# Patient Record
Sex: Female | Born: 1950
Health system: Southern US, Community
[De-identification: ages and names within clinical notes are randomized; demographics above are authoritative.]

## PROBLEM LIST (undated history)

## (undated) DIAGNOSIS — I10 Essential (primary) hypertension: Secondary | ICD-10-CM

---

## 2001-10-03 ENCOUNTER — Emergency Department (HOSPITAL_COMMUNITY): Admission: EM | Admit: 2001-10-03 | Discharge: 2001-10-04 | Payer: Self-pay | Admitting: Emergency Medicine

## 2001-10-04 ENCOUNTER — Encounter: Payer: Self-pay | Admitting: Emergency Medicine

## 2001-11-19 ENCOUNTER — Encounter: Admission: RE | Admit: 2001-11-19 | Discharge: 2001-11-19 | Payer: Self-pay | Admitting: Internal Medicine

## 2001-11-19 ENCOUNTER — Encounter: Payer: Self-pay | Admitting: Internal Medicine

## 2002-12-10 ENCOUNTER — Encounter: Admission: RE | Admit: 2002-12-10 | Discharge: 2002-12-10 | Payer: Self-pay | Admitting: Internal Medicine

## 2002-12-10 ENCOUNTER — Encounter: Payer: Self-pay | Admitting: Internal Medicine

## 2004-07-29 ENCOUNTER — Emergency Department (HOSPITAL_COMMUNITY): Admission: EM | Admit: 2004-07-29 | Discharge: 2004-07-29 | Payer: Self-pay | Admitting: Emergency Medicine

## 2004-08-09 ENCOUNTER — Encounter: Admission: RE | Admit: 2004-08-09 | Discharge: 2004-08-09 | Payer: Self-pay | Admitting: Internal Medicine

## 2006-07-24 ENCOUNTER — Emergency Department (HOSPITAL_COMMUNITY): Admission: EM | Admit: 2006-07-24 | Discharge: 2006-07-24 | Payer: Self-pay | Admitting: Emergency Medicine

## 2007-01-04 ENCOUNTER — Encounter: Admission: RE | Admit: 2007-01-04 | Discharge: 2007-01-04 | Payer: Self-pay | Admitting: Internal Medicine

## 2007-04-18 ENCOUNTER — Emergency Department (HOSPITAL_COMMUNITY): Admission: EM | Admit: 2007-04-18 | Discharge: 2007-04-19 | Payer: Self-pay | Admitting: Emergency Medicine

## 2007-06-04 ENCOUNTER — Emergency Department (HOSPITAL_COMMUNITY): Admission: EM | Admit: 2007-06-04 | Discharge: 2007-06-05 | Payer: Self-pay | Admitting: Emergency Medicine

## 2007-06-23 ENCOUNTER — Emergency Department (HOSPITAL_COMMUNITY): Admission: EM | Admit: 2007-06-23 | Discharge: 2007-06-23 | Payer: Self-pay | Admitting: Emergency Medicine

## 2007-06-27 ENCOUNTER — Inpatient Hospital Stay (HOSPITAL_COMMUNITY): Admission: EM | Admit: 2007-06-27 | Discharge: 2007-06-27 | Payer: Self-pay | Admitting: Emergency Medicine

## 2007-06-29 ENCOUNTER — Ambulatory Visit (HOSPITAL_BASED_OUTPATIENT_CLINIC_OR_DEPARTMENT_OTHER): Admission: RE | Admit: 2007-06-29 | Discharge: 2007-06-29 | Payer: Self-pay | Admitting: Cardiology

## 2007-07-07 ENCOUNTER — Ambulatory Visit: Payer: Self-pay | Admitting: Internal Medicine

## 2007-11-13 ENCOUNTER — Emergency Department (HOSPITAL_COMMUNITY): Admission: EM | Admit: 2007-11-13 | Discharge: 2007-11-14 | Payer: Self-pay | Admitting: Emergency Medicine

## 2008-07-03 IMAGING — US US ABDOMEN COMPLETE
1 series · 14 of 25 positions shown · non-contrast
Comparison: none

CLINICAL DATA: Chest pain.  Increased amylase.
 ABDOMEN ULTRASOUND:
TECHNIQUE: Complete abdominal ultrasound examination was performed including evaluation of the liver, gallbladder, bile ducts, pancreas, kidneys, spleen, IVC, and abdominal aorta.

[Series 1: unknown · 0.33mm/px · 14 of 57 slices shown]
[im 1/57]
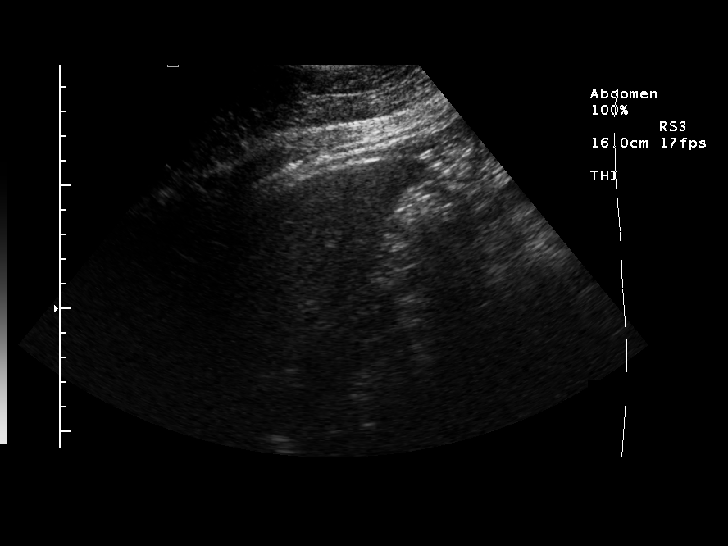
[im 5/57]
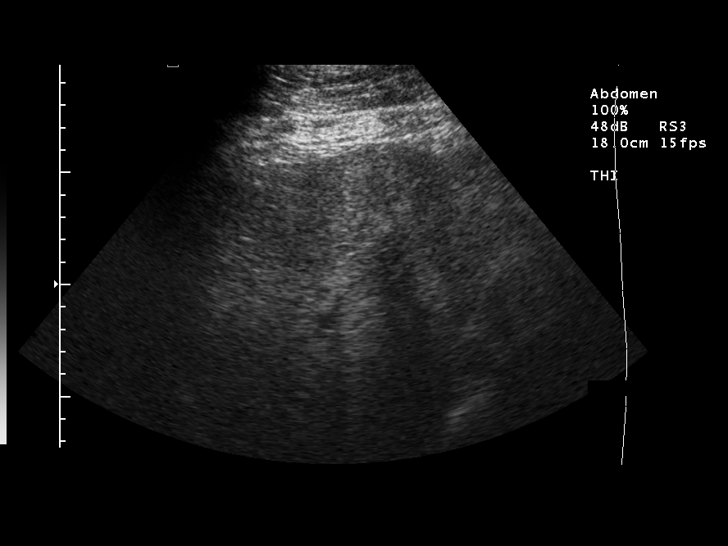
[im 10/57]
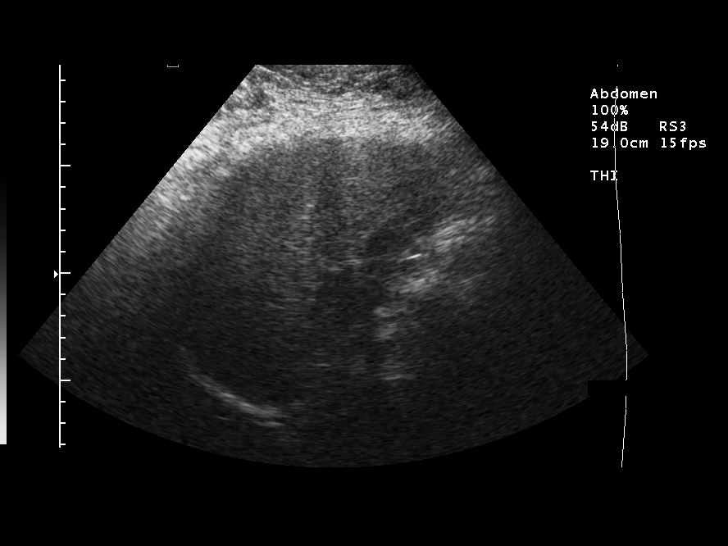
[im 15/57]
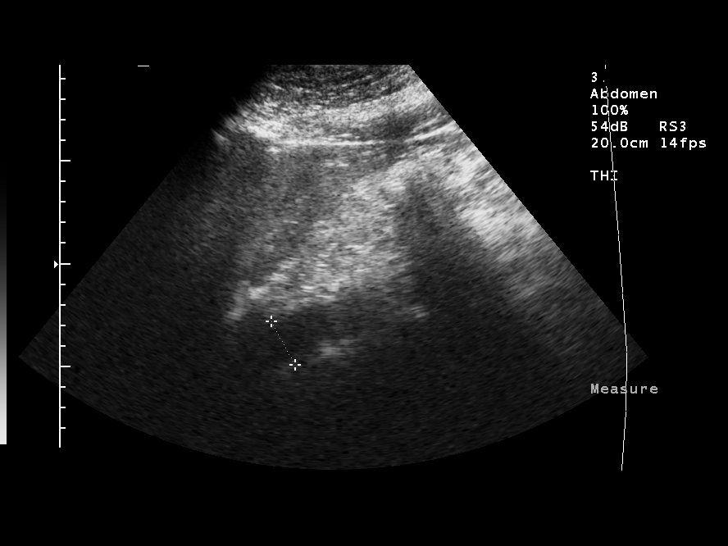
[im 19/57]
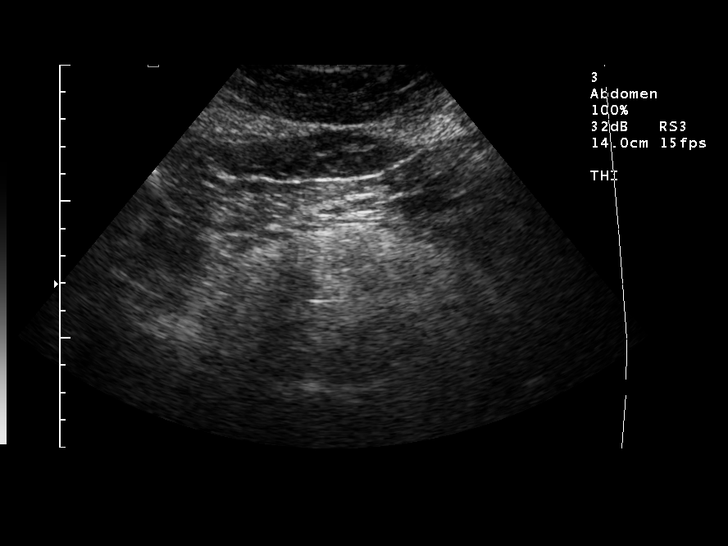
[im 22/57]
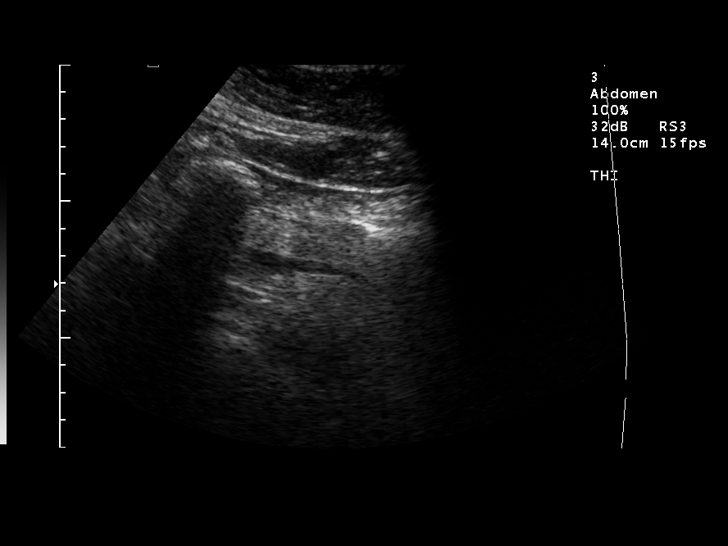
[im 26/57]
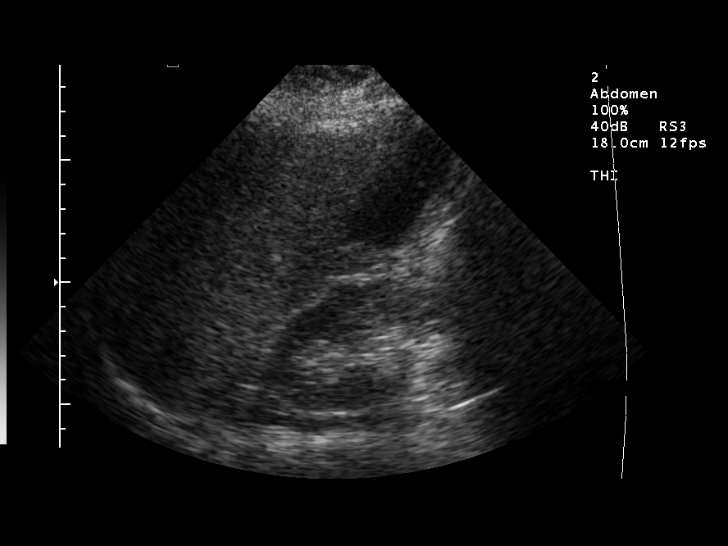
[im 31/57]
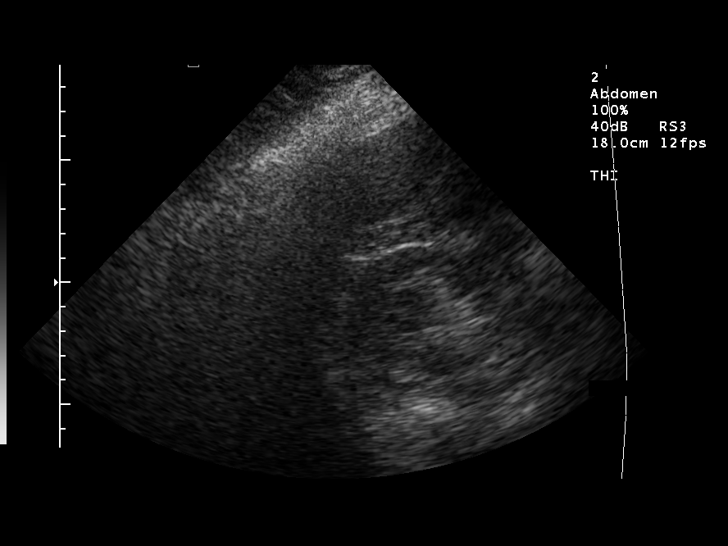
[im 36/57]
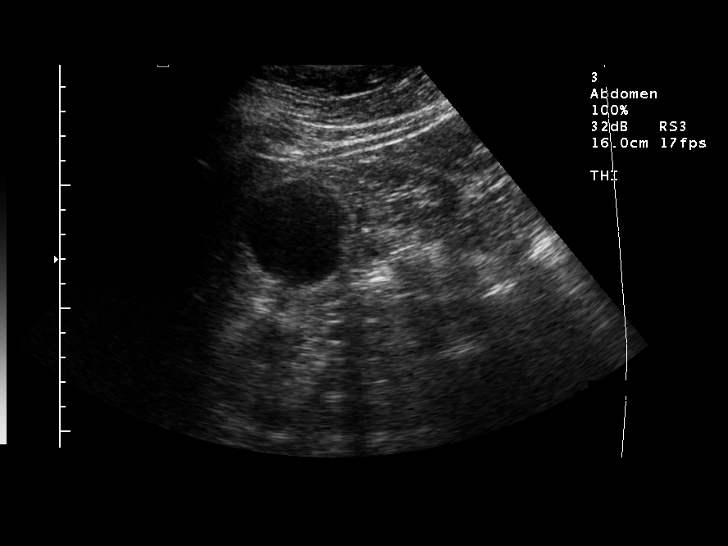
[im 38/57]
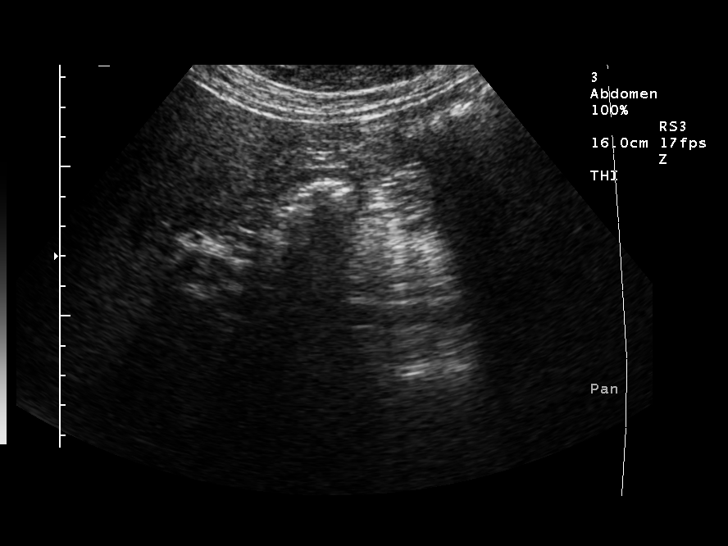
[im 43/57]
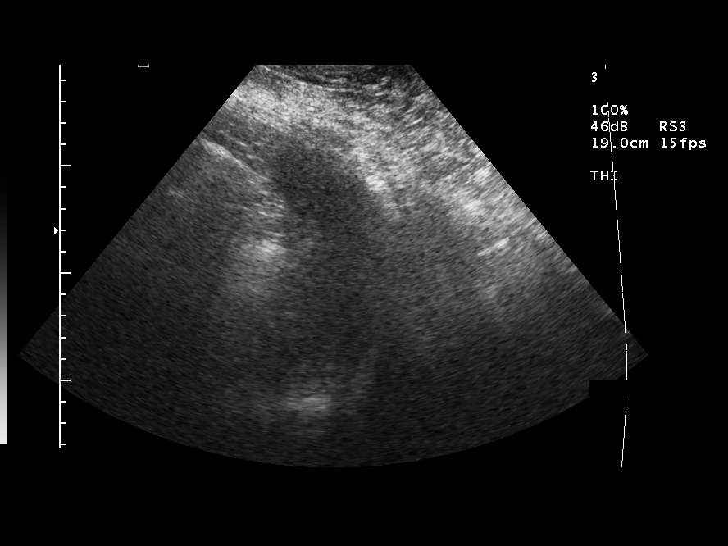
[im 47/57]
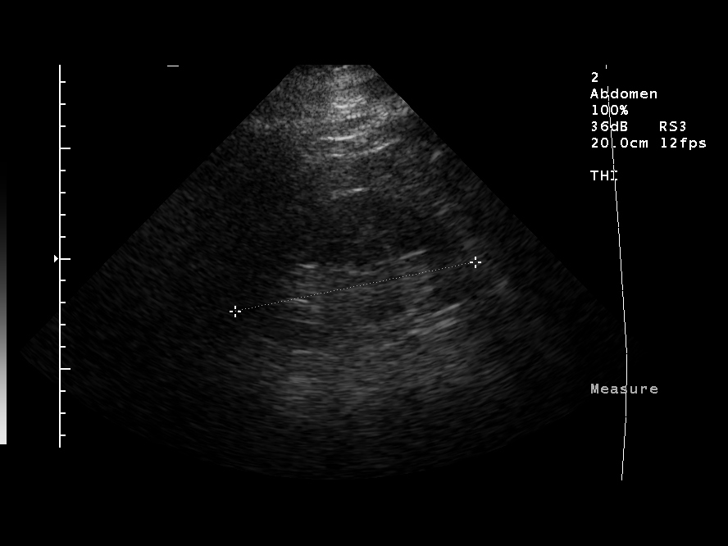
[im 52/57]
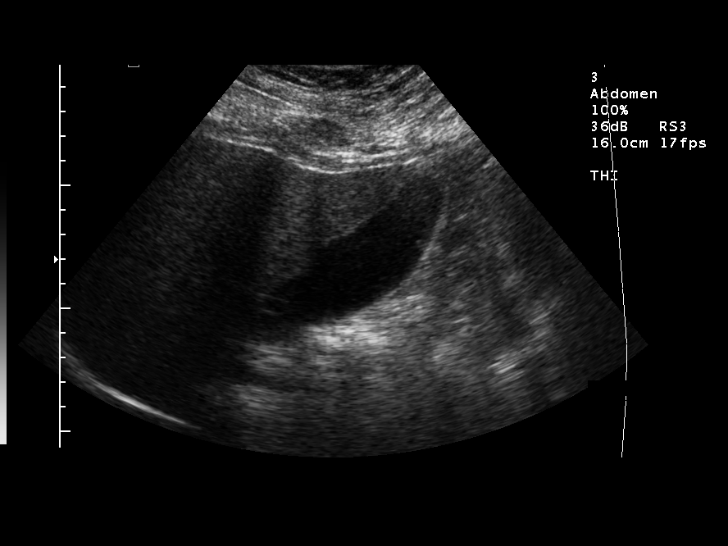
[im 57/57]
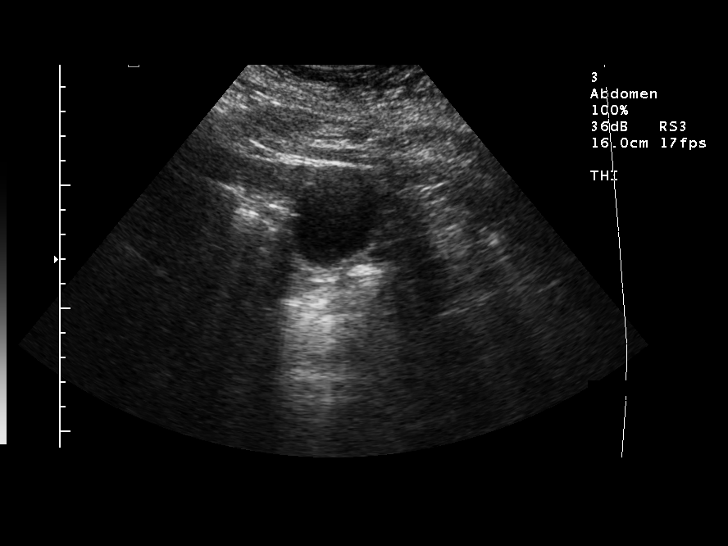

[14 of 25 positions shown; findings below may reference images not displayed]

FINDINGS: The gallbladder is well seen and no gallstones are noted.  The liver is slightly echodense suggesting fatty infiltration.  Common bile duct is normal measuring 3.6 mm in diameter.  The IVC, pancreas and spleen appear normal.  No hydronephrosis is seen.  The right kidney measures 10.7 cm sagittally with the left kidney measuring 11.1 cm. The abdominal aorta is normal in caliber.  This study is somewhat limited by patient body habitus.
IMPRESSION: 1. No gallstones.
 2. Mild fatty infiltration of the liver.

## 2010-02-02 ENCOUNTER — Emergency Department (HOSPITAL_COMMUNITY): Admission: EM | Admit: 2010-02-02 | Discharge: 2010-02-03 | Payer: Self-pay | Admitting: Emergency Medicine

## 2010-07-29 LAB — POCT CARDIAC MARKERS
CKMB, poc: <1 ng/mL — ABNORMAL LOW (ref 1.0–8.0)
CKMB, poc: <1 ng/mL — ABNORMAL LOW (ref 1.0–8.0)
Troponin i, poc: <0.05 ng/mL (ref 0.00–0.09)
Troponin i, poc: <0.05 ng/mL (ref 0.00–0.09)

## 2010-07-29 LAB — DIFFERENTIAL
Basophils Absolute: 0.1 10*3/uL (ref 0.0–0.1)
Basophils Relative: 1 % (ref 0–1)
Lymphs Abs: 1.8 10*3/uL (ref 0.7–4.0)

## 2010-07-29 LAB — CBC
Hemoglobin: 13.5 g/dL (ref 12.0–15.0)
RBC: 4.99 MIL/uL (ref 3.87–5.11)
WBC: 5.1 10*3/uL (ref 4.0–10.5)

## 2010-07-29 LAB — COMPREHENSIVE METABOLIC PANEL
ALT: 17 U/L (ref 0–35)
Alkaline Phosphatase: 71 U/L (ref 39–117)
CO2: 26 mEq/L (ref 19–32)
Chloride: 104 mEq/L (ref 96–112)
GFR calc non Af Amer: 43 mL/min — ABNORMAL LOW (ref >60)
Glucose, Bld: 111 mg/dL — ABNORMAL HIGH (ref 70–99)
Potassium: 3.2 mEq/L — ABNORMAL LOW (ref 3.5–5.1)
Sodium: 135 mEq/L (ref 135–145)
Total Bilirubin: 0.7 mg/dL (ref 0.3–1.2)
Total Protein: 8 g/dL (ref 6.0–8.3)

## 2010-09-28 NOTE — Procedures (Signed)
Annette Walker, Annette Walker               ACCOUNT NO.:  [id number]   MEDICAL RECORD NO.:  [id number]          PATIENT TYPE:  OUT   LOCATION:  SLEEP CENTER                 FACILITY:  [facility]   PHYSICIAN:  Clinton D. Maple Hudson, MD, FCCP, FACPDATE OF BIRTH:  September 01, 1952   DATE OF STUDY:  06/29/2007                            NOCTURNAL POLYSOMNOGRAM   REFERRING PHYSICIAN:  [name], MD   INDICATION FOR STUDY:  Hypersomnia with sleep apnea.   EPWORTH SLEEPINESS SCORE:  4/24 BMI 51.8, weight 311 pounds, height 65  inches. Neck 17 inches.   MEDICATIONS:  Home medication charted and reviewed.   SLEEP ARCHITECTURE:  Total sleep time 378 minutes with sleep efficiency  90.9%.  Stage I was 6.6%.  Stage II was 81.1%.  Stage III 0.9%.  REM  11.4% of total sleep time.  Sleep latency 16 minutes.  REM latency 101  minutes.  Awake after sleep onset 21 minutes.  Arousal index 6.  No  bedtime medication taken.   RESPIRATORY DATA:  Apnea/hypopnea index (AHI) 4.6 per hour.  Respiratory  disturbance index (RDI) 6.8 per hour.  These indicate very mild  obstructive sleep apnea/hypopnea syndrome.  There were a total of 29  scored respiratory events including 9 obstructive apneas, 4 central  apneas and 16 hypopnea's.  Events were not positional.  REM AHI 7 per  hour.   OXYGEN DATA:  Loud snoring with oxygen saturation to a nadir of 79%.  Mean oxygen saturation to this study was 94.6% on room air.  A total of  6.5 minutes were recorded with oxygen saturation less than 88%.   CARDIAC DATA:  Sinus rhythm.   MOVEMENT-PARASOMNIA:  No significant movement disturbance. No bathroom  trips.   IMPRESSIONS-RECOMMENDATIONS:  1. Unremarkable sleep architecture for sleep center environment with      relatively reduced percentage time spent in REM.  2. Very mild obstructive sleep apnea/hypopnea syndrome, AHI 4.6 per      hour, RDI 6.8 per hour (normal range 0-5 per hour).  Events were      not positional.  Loud snoring  with oxygen desaturation transiently      to a nadir of 79% and a mean saturation of 94.6% on room air.  3. Scores in this range would not usually be addressed with CPAP      therapy.  If considered clinically significant,      encouragement to lose weight, to sleep off flat of back, and      treatment for any significant nasopharyngeal obstruction might be      useful if appropriate.      Clinton D. Maple Hudson, MD, [facility], FACP  Diplomate, Biomedical engineer of Sleep Medicine  Electronically Signed     CDY/MEDQ  D:  07/07/2007 11:06:23  T:  07/08/2007 13:25:54  Job:  [id number]

## 2010-09-28 NOTE — Cardiovascular Report (Signed)
Annette Walker, VIRGO NO.:  [id number]   MEDICAL RECORD NO.:  [id number]          PATIENT TYPE:  INP   LOCATION:  6531                         FACILITY:  [facility]   PHYSICIAN:  Nicki Guadalajara, M.D.     DATE OF BIRTH:  1950-06-18   DATE OF PROCEDURE:  06/26/2007  DATE OF DISCHARGE:  06/27/2007                            CARDIAC CATHETERIZATION   INDICATIONS:  Ms. Annette Walker is a 60 year old African-American female  who has a history of obesity and hypertension.  She had experienced  recent episodes of chest pain.  A prior nuclear study had raised the  possibility of possible mild anterior wall ischemia, but the patient was  significantly obese; and due to her large breasts, breast attenuation  could not be excluded.  The patient was, again, seen in the emergency  room with chest pain.  She was scheduled, therefore, to undergo  definitive cardiac catheterization tomorrow by Dr. Lynnea Ferrier.  She  apparently, again, presented to the hospital, today, with chest pain.  She is now referred for definitive catheterization today.   PROCEDURE:  After premedication with Versed 2 mg intravenously, the  patient was prepped and draped in the usual fashion.  Her right femoral  artery was punctured anteriorly, and a 5-French sheath was inserted  without difficulty.  Diagnostic cardiac catheterization was done  utilizing 5-French Judkins-4 left-and-right coronary catheters.  Then 2  mcg IC nitroglycerin was administered down the left coronary catheter.  A 5-French pigtail catheter was used for RAO ventriculography as well as  distal aortography.  The patient tolerated the procedure well.  Hemostasis was obtained by direct manual pressure.   HEMODYNAMIC DATA:  Central aortic pressure was 127/84.  Left ventricular  pressure was 127/22.   ANGIOGRAPHIC DATA:  1. Left main coronary was angiographically normal and trifurcated into      an LAD, the ramus intermediate vessel, and left  circumflex system.      All vessels were tortuous.  2. The LAD gave rise to proximal large diagonal vessel.  The      midportion of the LAD seemed to dip intramyocardially.  In this      segment there appeared to be 30%-40% narrowing.  This did not      significantly improve following intracoronary nitroglycerin, and      remained essentially the same.  There was no significant disease in      other regions of the LAD.  3. The ramus intermediate vessel was a moderate size bifurcating      vessel which was free of significant disease.  4. The circumflex was tortuous.  There was mild 20% narrowing in a      marginal vessel, not felt to be significant.  5. The right coronary artery was a tortuous vessel with a superior      takeoff which was free of significant disease.  6. RAO ventriculography revealed normal global contractility.      Ejection fraction is at least 65%.  There are no focal segmental      wall motion abnormalities.  There was no evidence for  mitral      regurgitation.  7. Distal aortography revealed an essentially normal aortoiliac      system.  Visualization of the ostium of the left iliac was      suboptimal, and initially, although it appeared that there may be a      possible 30% narrowing at this site, there was streaming of dye      making visualization suboptimal.   IMPRESSION:  1. Normal LV function.  2. Very mild coronary artery disease with 30%-40% LAD narrowing in the      mid-LAD and a portion which seemed to dip myocardially, not      significantly improved following IC nitroglycerin administration.  3. Now a 20% narrowing in the OM branch of the left circumflex      coronary artery.  4. Normal ramus intermediate vessel.  5. Normal right coronary artery.   RECOMMENDATIONS:  Medical therapy.           ______________________________  Nicki Guadalajara, M.D.     TK/MEDQ  D:  06/26/2007  T:  06/28/2007  Job:  [id number]   cc:   [name], MD  [name], M.D.

## 2010-10-01 NOTE — Discharge Summary (Signed)
[name], Walker               ACCOUNT NO.:  [id number]   MEDICAL RECORD NO.:  [id number]          PATIENT TYPE:  INP   LOCATION:  6531                         FACILITY:  [facility]   PHYSICIAN:  Annette Walker, Annette WalkerDATE OF BIRTH:  01-14-1951   DATE OF ADMISSION:  06/26/2007  DATE OF DISCHARGE:  06/27/2007                               DISCHARGE SUMMARY   DISCHARGE DIAGNOSES:  1. Chest pain. Negative for myocardial infarction.      a.     Nonobstructive coronary disease by cardiac catheterization.  2. False positive Myoview study.  3. Presyncope prior to chest pain, resolved.  4. Obstructive sleep apnea.  5. Hypertension.  6. Obesity.   DISCHARGE CONDITION:  Improved.   PROCEDURES:  June 26, 2007 combined left heart cath by Dr. Tresa Endo,  20% circ and 20-40% disease of the left system and ejection fraction of  65%.    Please note the patient was not discharged by myself. She was to be  discharged by hospital __________ once her gallbladder ultrasound was  done.   DISCHARGE MEDICATIONS:  1. Benicar 40 mg daily.  2. Metoprolol 100 mg twice a day.  3. Aspirin 81  mg daily.  4. Nitroglycerin sublingual as needed for chest pain.   PRE-HOSPITAL MEDICATIONS:  1. Benicar 40 mg daily.  2. Metoprolol 100 mg twice a day.  3. Aspirin 81  mg daily.  4. Nitroglycerin sublingual as needed for chest pain.   DISCHARGE INSTRUCTIONS:  Follow up with Dr. Lynnea Ferrier in two weeks.   HISTORY OF PRESENT ILLNESS:  A 60 year old African American female who  presented to the emergency room on June 26, 2007 with chest pain.  She had been scheduled for cardiac catheterization on June 27, 2007,  but presented to the emergency room on June 26, 2007. She had a  nuclear study in our office on February 5, and that was positive for  ischemia but she had not agreed to a cardiac cath at that time. She had  continued episodes of chest pain and was seen by Dr. Lynnea Ferrier and planned  for cardiac  cath. On June 26, 2007 on her way to work she felt dizzy  and presyncopal. At work, she was dizzy, got worse, chest pressure built  up. She had no associated shortness of breath, radiation, or  diaphoresis, but because of her continued episodes she came to the  emergency room.   FAMILY HISTORY/SOCIAL HISTORY/REVIEW OF SYSTEMS:  See history and  physical.   PHYSICAL EXAMINATION AT DISCHARGE:  Blood pressure 91/41, pulse 63,  respirations 20, temperature 98.  HEART:  Regular rate and rhythm.  EXTREMITIES:  Right groin site stable.  NECK:  No JVD.  LUNGS:  Clear without rales.   LABORATORY DATA:  Hemoglobin 12.9, hematocrit 37, WBC 4.4, platelets  225, neutrophil 67, lymphs 26.4, eos 2, basos 0. Sodium 141, potassium  3.2, chloride 107, glucose 115, BUN 21, creatinine 1.22 down to 1.03,  total protein 8.9, albumin 3.5, AST 26, ALT 18, alkaline-phosphatase 75,  total bilirubin 0.9, direct bilirubin 0.2, indirect bilirubin 0.7,  amylase was elevated  at 150, lipase 25, CK ranged 113 to 69, MBs were  negative, troponin 0.01 to 0.02 negative. We did a gallbladder  ultrasound. There were no gallstones. She did have a mildly fatty  infiltration of her liver. EKGs reveal sinus rhythm. No acute changes.   HOSPITAL COURSE:  The patient was admitted for chest pain by Dr. Tresa Endo  on June 26, 2007 and underwent elective cardiac catheterization that  same day. Was found to have nonobstructive coronary disease and normal  LV. By the next morning she was stable. Potassium was low; that was  replaced. She was to have her amlodipine changed to [place], but she  continued on Sular at discharge. Gallbladder ultrasound was negative for  gallstones for recurrent chest pain. Her amylase was elevated to 150.  She will need to follow up with her primary care for abnormal liver  functions. She was discharged home and will follow up as an outpatient.      Annette Walker. Annette Walker, N.P.     ______________________________  Annette Walker, M.D.    LRI/MEDQ  D:  07/16/2007  T:  07/16/2007  Job:  [id number]   cc:   Annette Walker, M.D.  [name], MD  [name], M.D.

## 2011-02-03 LAB — COMPREHENSIVE METABOLIC PANEL
Albumin: 3.3 — ABNORMAL LOW
Alkaline Phosphatase: 79
BUN: 28 — ABNORMAL HIGH
Calcium: 8.8
Creatinine, Ser: 2 — ABNORMAL HIGH
Glucose, Bld: 116 — ABNORMAL HIGH
Potassium: 3.4 — ABNORMAL LOW
Total Protein: 7.6

## 2011-02-03 LAB — CBC
Hemoglobin: 12.2
MCHC: 34.6
MCV: 80.7
RBC: 4.37
RDW: 13.7

## 2011-02-03 LAB — DIFFERENTIAL
Eosinophils Absolute: 0.3
Lymphocytes Relative: 33
Lymphs Abs: 2.1
Monocytes Relative: 7
Neutro Abs: 3.5
Neutrophils Relative %: 54

## 2011-02-03 LAB — POCT CARDIAC MARKERS
CKMB, poc: 1.1
Myoglobin, poc: 98.8
Operator id: 1192
Troponin i, poc: <0.05

## 2011-02-04 LAB — POCT CARDIAC MARKERS
CKMB, poc: <1 — ABNORMAL LOW
CKMB, poc: <1 — ABNORMAL LOW
Myoglobin, poc: 81.7
Myoglobin, poc: 106
Myoglobin, poc: 94.1
Operator id: 285841
Operator id: 294501
Troponin i, poc: <0.05

## 2011-02-04 LAB — DIFFERENTIAL
Basophils Absolute: 0
Basophils Absolute: 0
Basophils Relative: 0
Eosinophils Absolute: 0.1
Eosinophils Relative: 3
Lymphs Abs: 2.8
Monocytes Absolute: 0.3
Monocytes Relative: 4
Neutro Abs: 3.1
Neutrophils Relative %: 67

## 2011-02-04 LAB — HEPATIC FUNCTION PANEL
Bilirubin, Direct: 0.2
Indirect Bilirubin: 0.7
Total Bilirubin: 0.9

## 2011-02-04 LAB — CK TOTAL AND CKMB (NOT AT ARMC)
CK, MB: 0.8
CK, MB: 1.1
Total CK: 70

## 2011-02-04 LAB — BASIC METABOLIC PANEL
CO2: 26
CO2: 27
Calcium: 8.7
Chloride: 108
Creatinine, Ser: 1.03
Creatinine, Ser: 1.22 — ABNORMAL HIGH
GFR calc Af Amer: 56 — ABNORMAL LOW
GFR calc Af Amer: >60
GFR calc non Af Amer: 56 — ABNORMAL LOW
Glucose, Bld: 93
Glucose, Bld: 99

## 2011-02-04 LAB — CBC
MCHC: 34.1
MCHC: 34.8
MCV: 81.1
MCV: 81.7
Platelets: ADEQUATE
Platelets: 225
RBC: 4.61
RBC: 3.86 — ABNORMAL LOW
RDW: 14
RDW: 14.5
WBC: 6.4

## 2011-02-04 LAB — COMPREHENSIVE METABOLIC PANEL
ALT: 20
AST: 34
Alkaline Phosphatase: 78
CO2: 25
Calcium: 9.2
GFR calc non Af Amer: 32 — ABNORMAL LOW
Glucose, Bld: 130 — ABNORMAL HIGH
Potassium: 4
Sodium: 138

## 2011-02-04 LAB — CARDIAC PANEL(CRET KIN+CKTOT+MB+TROPI)
Relative Index: INVALID
Total CK: 69
Troponin I: 0.01

## 2011-02-04 LAB — I-STAT 8, (EC8 V) (CONVERTED LAB)
Chloride: 107
Glucose, Bld: 115 — ABNORMAL HIGH
Potassium: 3.2 — ABNORMAL LOW
pCO2, Ven: 37.8 — ABNORMAL LOW
pH, Ven: 7.468 — ABNORMAL HIGH

## 2011-02-04 LAB — AMYLASE: Amylase: 150 — ABNORMAL HIGH

## 2011-02-04 LAB — POCT I-STAT CREATININE: Creatinine, Ser: 1.5 — ABNORMAL HIGH

## 2011-02-04 LAB — LIPASE, BLOOD: Lipase: 25

## 2011-02-10 LAB — POCT CARDIAC MARKERS
CKMB, poc: <1 — ABNORMAL LOW
Myoglobin, poc: 80.9
Troponin i, poc: 0.06 — ABNORMAL HIGH

## 2011-02-10 LAB — CBC
Hemoglobin: 12.4
RBC: 4.43
WBC: 4.9

## 2011-02-10 LAB — CK TOTAL AND CKMB (NOT AT ARMC)
CK, MB: 0.9
Relative Index: INVALID
Total CK: 62

## 2011-02-10 LAB — POCT I-STAT, CHEM 8
HCT: 38
Hemoglobin: 12.9
Potassium: 3.4 — ABNORMAL LOW
Sodium: 141

## 2011-02-10 LAB — DIFFERENTIAL
Lymphs Abs: 1.2
Monocytes Relative: 6
Neutro Abs: 3.2
Neutrophils Relative %: 66

## 2011-02-21 LAB — POCT CARDIAC MARKERS
CKMB, poc: <1 — ABNORMAL LOW
Myoglobin, poc: 59.4
Troponin i, poc: <0.05

## 2011-02-21 LAB — DIFFERENTIAL
Basophils Absolute: 0
Basophils Relative: 0
Monocytes Absolute: 0.3
Neutro Abs: 4.2
Neutrophils Relative %: 68

## 2011-02-21 LAB — I-STAT 8, (EC8 V) (CONVERTED LAB)
BUN: 17
Bicarbonate: 25.4 — ABNORMAL HIGH
Glucose, Bld: 147 — ABNORMAL HIGH
HCT: 40
Operator id: 270111
pCO2, Ven: 31.6 — ABNORMAL LOW

## 2011-02-21 LAB — CBC
Hemoglobin: 12.5
MCHC: 34.8
RDW: 14.5

## 2013-03-27 ENCOUNTER — Ambulatory Visit: Payer: Self-pay | Admitting: Obstetrics & Gynecology

## 2014-01-09 ENCOUNTER — Other Ambulatory Visit: Payer: Self-pay | Admitting: Internal Medicine

## 2014-01-09 DIAGNOSIS — Z1231 Encounter for screening mammogram for malignant neoplasm of breast: Secondary | ICD-10-CM

## 2014-12-19 ENCOUNTER — Other Ambulatory Visit: Payer: Self-pay | Admitting: Internal Medicine

## 2014-12-19 DIAGNOSIS — E2839 Other primary ovarian failure: Secondary | ICD-10-CM

## 2015-08-05 DIAGNOSIS — Z1239 Encounter for other screening for malignant neoplasm of breast: Secondary | ICD-10-CM | POA: Diagnosis not present

## 2015-08-05 DIAGNOSIS — I10 Essential (primary) hypertension: Secondary | ICD-10-CM | POA: Diagnosis not present

## 2015-08-05 DIAGNOSIS — K219 Gastro-esophageal reflux disease without esophagitis: Secondary | ICD-10-CM | POA: Diagnosis not present

## 2015-08-05 DIAGNOSIS — N182 Chronic kidney disease, stage 2 (mild): Secondary | ICD-10-CM | POA: Diagnosis not present

## 2015-09-15 ENCOUNTER — Encounter (HOSPITAL_COMMUNITY): Payer: Self-pay | Admitting: Emergency Medicine

## 2015-09-15 ENCOUNTER — Inpatient Hospital Stay (HOSPITAL_COMMUNITY)
Admission: EM | Admit: 2015-09-15 | Discharge: 2015-09-21 | DRG: 853 | Disposition: A | Discharge status: Home Health | Payer: 59 | Attending: Internal Medicine | Admitting: Internal Medicine

## 2015-09-15 ENCOUNTER — Encounter (HOSPITAL_COMMUNITY)
Admission: EM | Disposition: A | Discharge status: Home Health | Payer: Self-pay | Source: Home / Self Care | Attending: Internal Medicine

## 2015-09-15 ENCOUNTER — Emergency Department (HOSPITAL_COMMUNITY): Payer: 59

## 2015-09-15 ENCOUNTER — Ambulatory Visit (HOSPITAL_COMMUNITY)
Admission: EM | Admit: 2015-09-15 | Discharge: 2015-09-15 | Disposition: A | Discharge status: Nursing Home (Includes ICF) | Payer: 59 | Source: Home / Self Care | Attending: Emergency Medicine | Admitting: Emergency Medicine

## 2015-09-15 DIAGNOSIS — G92 Toxic encephalopathy: Secondary | ICD-10-CM | POA: Diagnosis not present

## 2015-09-15 DIAGNOSIS — J9600 Acute respiratory failure, unspecified whether with hypoxia or hypercapnia: Secondary | ICD-10-CM

## 2015-09-15 DIAGNOSIS — R000 Tachycardia, unspecified: Secondary | ICD-10-CM | POA: Diagnosis present

## 2015-09-15 DIAGNOSIS — R6521 Severe sepsis with septic shock: Secondary | ICD-10-CM | POA: Diagnosis present

## 2015-09-15 DIAGNOSIS — J95821 Acute postprocedural respiratory failure: Secondary | ICD-10-CM | POA: Diagnosis not present

## 2015-09-15 DIAGNOSIS — N179 Acute kidney failure, unspecified: Secondary | ICD-10-CM

## 2015-09-15 DIAGNOSIS — L02214 Cutaneous abscess of groin: Secondary | ICD-10-CM | POA: Diagnosis not present

## 2015-09-15 DIAGNOSIS — I951 Orthostatic hypotension: Secondary | ICD-10-CM | POA: Diagnosis present

## 2015-09-15 DIAGNOSIS — L0291 Cutaneous abscess, unspecified: Secondary | ICD-10-CM | POA: Diagnosis not present

## 2015-09-15 DIAGNOSIS — J9690 Respiratory failure, unspecified, unspecified whether with hypoxia or hypercapnia: Secondary | ICD-10-CM

## 2015-09-15 DIAGNOSIS — E872 Acidosis, unspecified: Secondary | ICD-10-CM | POA: Insufficient documentation

## 2015-09-15 DIAGNOSIS — R531 Weakness: Secondary | ICD-10-CM

## 2015-09-15 DIAGNOSIS — R100 Acute abdomen: Secondary | ICD-10-CM | POA: Diagnosis not present

## 2015-09-15 DIAGNOSIS — E876 Hypokalemia: Secondary | ICD-10-CM | POA: Diagnosis present

## 2015-09-15 DIAGNOSIS — S31109A Unspecified open wound of abdominal wall, unspecified quadrant without penetration into peritoneal cavity, initial encounter: Secondary | ICD-10-CM | POA: Diagnosis present

## 2015-09-15 DIAGNOSIS — Z6841 Body Mass Index (BMI) 40.0 and over, adult: Secondary | ICD-10-CM | POA: Diagnosis not present

## 2015-09-15 DIAGNOSIS — D509 Iron deficiency anemia, unspecified: Secondary | ICD-10-CM | POA: Diagnosis present

## 2015-09-15 DIAGNOSIS — J9601 Acute respiratory failure with hypoxia: Secondary | ICD-10-CM | POA: Diagnosis not present

## 2015-09-15 DIAGNOSIS — S31109D Unspecified open wound of abdominal wall, unspecified quadrant without penetration into peritoneal cavity, subsequent encounter: Secondary | ICD-10-CM | POA: Diagnosis not present

## 2015-09-15 DIAGNOSIS — M726 Necrotizing fasciitis: Secondary | ICD-10-CM

## 2015-09-15 DIAGNOSIS — I10 Essential (primary) hypertension: Secondary | ICD-10-CM | POA: Diagnosis present

## 2015-09-15 DIAGNOSIS — Z7982 Long term (current) use of aspirin: Secondary | ICD-10-CM

## 2015-09-15 DIAGNOSIS — A419 Sepsis, unspecified organism: Principal | ICD-10-CM | POA: Diagnosis present

## 2015-09-15 DIAGNOSIS — R109 Unspecified abdominal pain: Secondary | ICD-10-CM

## 2015-09-15 DIAGNOSIS — L0889 Other specified local infections of the skin and subcutaneous tissue: Secondary | ICD-10-CM | POA: Diagnosis not present

## 2015-09-15 DIAGNOSIS — E869 Volume depletion, unspecified: Secondary | ICD-10-CM | POA: Diagnosis present

## 2015-09-15 HISTORY — PX: IRRIGATION AND DEBRIDEMENT ABDOMEN: SHX6600

## 2015-09-15 HISTORY — DX: Essential (primary) hypertension: I10

## 2015-09-15 LAB — CBC WITH DIFFERENTIAL/PLATELET
BASOS ABS: 0 10*3/uL (ref 0.0–0.1)
Basophils Relative: 0 %
EOS ABS: 0 10*3/uL (ref 0.0–0.7)
Eosinophils Relative: 0 %
HCT: 35.4 % — ABNORMAL LOW (ref 36.0–46.0)
HEMOGLOBIN: 12.1 g/dL (ref 12.0–15.0)
LYMPHS ABS: 1 10*3/uL (ref 0.7–4.0)
LYMPHS PCT: 4 %
MCH: 27.4 pg (ref 26.0–34.0)
MCHC: 34.2 g/dL (ref 30.0–36.0)
MCV: 80.3 fL (ref 78.0–100.0)
Monocytes Absolute: 2.2 10*3/uL — ABNORMAL HIGH (ref 0.1–1.0)
Monocytes Relative: 9 %
NEUTROS PCT: 87 %
Neutro Abs: 21 10*3/uL — ABNORMAL HIGH (ref 1.7–7.7)
PLATELETS: 254 10*3/uL (ref 150–400)
RBC: 4.41 MIL/uL (ref 3.87–5.11)
RDW: 14.5 % (ref 11.5–15.5)
WBC: 24.3 10*3/uL — AB (ref 4.0–10.5)

## 2015-09-15 LAB — COMPREHENSIVE METABOLIC PANEL
ALK PHOS: 73 U/L (ref 38–126)
ALT: 23 U/L (ref 14–54)
AST: 43 U/L — AB (ref 15–41)
Albumin: 2.5 g/dL — ABNORMAL LOW (ref 3.5–5.0)
Anion gap: 21 — ABNORMAL HIGH (ref 5–15)
BILIRUBIN TOTAL: 1.7 mg/dL — AB (ref 0.3–1.2)
BUN: 51 mg/dL — AB (ref 6–20)
CHLORIDE: 97 mmol/L — AB (ref 101–111)
CO2: 18 mmol/L — ABNORMAL LOW (ref 22–32)
Calcium: 8.6 mg/dL — ABNORMAL LOW (ref 8.9–10.3)
Creatinine, Ser: 4.5 mg/dL — ABNORMAL HIGH (ref 0.44–1.00)
GFR calc Af Amer: 11 mL/min — ABNORMAL LOW (ref >60)
GFR, EST NON AFRICAN AMERICAN: 10 mL/min — AB (ref >60)
Glucose, Bld: 136 mg/dL — ABNORMAL HIGH (ref 65–99)
Potassium: 2.9 mmol/L — ABNORMAL LOW (ref 3.5–5.1)
Sodium: 136 mmol/L (ref 135–145)
Total Protein: 8 g/dL (ref 6.5–8.1)

## 2015-09-15 LAB — I-STAT CG4 LACTIC ACID, ED: Lactic Acid, Venous: 6.09 mmol/L (ref 0.5–2.0)

## 2015-09-15 SURGERY — IRRIGATION AND DEBRIDEMENT ABDOMEN
Anesthesia: General | Site: Abdomen

## 2015-09-15 MED ORDER — MIDAZOLAM HCL 2 MG/2ML IJ SOLN
INTRAMUSCULAR | Status: AC
  Filled 2015-09-15: qty 2

## 2015-09-15 MED ORDER — SODIUM CHLORIDE 0.9 % IV BOLUS (SEPSIS)
1000.0000 mL | Freq: Once | INTRAVENOUS | Status: AC
  Administered 2015-09-15: 1000 mL via INTRAVENOUS

## 2015-09-15 MED ORDER — FENTANYL CITRATE (PF) 250 MCG/5ML IJ SOLN
INTRAMUSCULAR | Status: AC
  Filled 2015-09-15: qty 5

## 2015-09-15 MED ORDER — SODIUM CHLORIDE 0.9 % IV BOLUS (SEPSIS)
500.0000 mL | Freq: Once | INTRAVENOUS | Status: AC
  Administered 2015-09-15: 500 mL via INTRAVENOUS

## 2015-09-15 MED ORDER — PIPERACILLIN-TAZOBACTAM IN DEX 2-0.25 GM/50ML IV SOLN
2.2500 g | Freq: Three times a day (TID) | INTRAVENOUS | Status: DC
  Administered 2015-09-16 (×2): 2.25 g via INTRAVENOUS
  Filled 2015-09-15 (×5): qty 50

## 2015-09-15 MED ORDER — VANCOMYCIN HCL 10 G IV SOLR
2500.0000 mg | Freq: Once | INTRAVENOUS | Status: AC
  Administered 2015-09-15: 2500 mg via INTRAVENOUS
  Filled 2015-09-15: qty 2500

## 2015-09-15 MED ORDER — PIPERACILLIN-TAZOBACTAM 3.375 G IVPB 30 MIN
3.3750 g | INTRAVENOUS | Status: AC
  Administered 2015-09-15: 3.375 g via INTRAVENOUS
  Filled 2015-09-15: qty 50

## 2015-09-15 MED ORDER — CLINDAMYCIN PHOSPHATE 600 MG/50ML IV SOLN
600.0000 mg | Freq: Once | INTRAVENOUS | Status: AC
  Administered 2015-09-16: 600 mg via INTRAVENOUS
  Filled 2015-09-15: qty 50

## 2015-09-15 MED ORDER — PROPOFOL 10 MG/ML IV BOLUS
INTRAVENOUS | Status: AC
  Filled 2015-09-15: qty 20

## 2015-09-15 SURGICAL SUPPLY — 31 items
BNDG GAUZE ELAST 4 BULKY (GAUZE/BANDAGES/DRESSINGS) ×4 IMPLANT
CANISTER SUCT LVC 12 LTR MEDI- (MISCELLANEOUS) IMPLANT
CANISTER SUCTION 2500CC (MISCELLANEOUS) ×2 IMPLANT
CANISTER WOUND CARE 500ML ATS (WOUND CARE) ×2 IMPLANT
COVER SURGICAL LIGHT HANDLE (MISCELLANEOUS) ×2 IMPLANT
DRAPE LAPAROSCOPIC ABDOMINAL (DRAPES) ×2 IMPLANT
DRAPE UTILITY XL STRL (DRAPES) ×4 IMPLANT
DRSG PAD ABDOMINAL 8X10 ST (GAUZE/BANDAGES/DRESSINGS) ×4 IMPLANT
ELECT REM PT RETURN 9FT ADLT (ELECTROSURGICAL) ×2
ELECTRODE REM PT RTRN 9FT ADLT (ELECTROSURGICAL) ×1 IMPLANT
GLOVE BIO SURGEON STRL SZ8 (GLOVE) ×2 IMPLANT
GLOVE BIOGEL PI IND STRL 8 (GLOVE) ×1 IMPLANT
GLOVE BIOGEL PI INDICATOR 8 (GLOVE) ×1
GOWN STRL REUS W/ TWL LRG LVL3 (GOWN DISPOSABLE) ×2 IMPLANT
GOWN STRL REUS W/ TWL XL LVL3 (GOWN DISPOSABLE) ×1 IMPLANT
GOWN STRL REUS W/TWL LRG LVL3 (GOWN DISPOSABLE) ×2
GOWN STRL REUS W/TWL XL LVL3 (GOWN DISPOSABLE) ×1
HANDPIECE INTERPULSE COAX TIP (DISPOSABLE) ×2
KIT BASIN OR (CUSTOM PROCEDURE TRAY) ×2 IMPLANT
KIT ROOM TURNOVER OR (KITS) ×2 IMPLANT
NS IRRIG 1000ML POUR BTL (IV SOLUTION) ×2 IMPLANT
PACK GENERAL/GYN (CUSTOM PROCEDURE TRAY) ×2 IMPLANT
PAD ARMBOARD 7.5X6 YLW CONV (MISCELLANEOUS) ×4 IMPLANT
SET CYSTO W/LG BORE CLAMP LF (SET/KITS/TRAYS/PACK) ×2 IMPLANT
SET HNDPC FAN SPRY TIP SCT (DISPOSABLE) ×1 IMPLANT
SPONGE ABDOMINAL VAC ABTHERA (MISCELLANEOUS) ×2 IMPLANT
SUCTION POOLE TIP (SUCTIONS) IMPLANT
TAPE CLOTH SURG 6X10 WHT LF (GAUZE/BANDAGES/DRESSINGS) ×2 IMPLANT
TOWEL OR 17X24 6PK STRL BLUE (TOWEL DISPOSABLE) ×2 IMPLANT
TOWEL OR 17X26 10 PK STRL BLUE (TOWEL DISPOSABLE) ×2 IMPLANT
WATER STERILE IRR 1000ML POUR (IV SOLUTION) IMPLANT

## 2015-09-15 NOTE — Progress Notes (Addendum)
  Addendum: SCr 4.50 - estimated CrCl 19.1 mL/min.   Plan: No further vancomycin - follow-up renal function, likely will need >= 48 hour dosing per obesity nomogram.  Zosyn 2.25g IV every 8 hours.   Link SnufferJessica Moria Brophy, PharmD, BCPS Clinical Pharmacist [phone] 09/15/2015, 8:46 PM   Pharmacy Antibiotic Note  Annette Walker is a 65 y.o. female admitted on 09/15/2015 with sepsis.  Pharmacy has been consulted for vancomycin and Zosyn dosing. WBC and Lactic acid elevated.   Plan: Vancomycin 2500mg  IV x1 now Zosyn 3.375g IV x1 now over 30min Further doses pending SCr.   Height: 5\' 6"  (167.6 cm) Weight: (!) 319 lb (144.697 kg) IBW/kg (Calculated) : 59.3  Temp (24hrs), Avg:99.1 F (37.3 C), Min:98.4 F (36.9 C), Max:99.8 F (37.7 C)   Recent Labs Lab 09/15/15 1929 09/15/15 1946  WBC 24.3*  --   LATICACIDVEN  --  6.09*    CrCl cannot be calculated (Patient has no serum creatinine result on file.).    No Known Allergies  Antimicrobials this admission: 5/2 Vancomycin >> 5/2 Zosyn >>  Dose adjustments this admission: na  Microbiology results: 5/2 BCx:  5/2 UCx:   Thank you for allowing pharmacy to be a part of this patient's care.  Link SnufferJessica Donoven Pett, PharmD, BCPS Clinical Pharmacist [phone]  09/15/2015 8:23 PM

## 2015-09-15 NOTE — ED Provider Notes (Signed)
CSN: [id number]     Arrival date & time 09/15/15  1852 History   First MD Initiated Contact with Patient 09/15/15 1947     Chief Complaint  Patient presents with  . Abscess     (Consider location/radiation/quality/duration/timing/severity/associated sxs/prior Treatment) HPI Annette Walker is a 65 year old female who presents the emergency department tonight after being sent here from urgent care with concern for soft tissue infection. She reports that 2 days ago she noticed what she thought was a "boil" on her right inguinal area. She says that it continued to grow, and then today began draining purulent fluid. She has been feeling a little bit fatigued and tired today, but until today had been feeling in her usual state of health. She denies any fevers or chills, but does feel fatigued, and lightheaded with standing. She was evaluated at urgent care, and sent here due to concern for serious soft tissue infection. She complains of a painful, draining, wound on her right inguinal crease. She says it's been draining purulent, foul-smelling fluid all day. She has no history of serious soft tissue or skin infections. She has no history of abscesses. She is not diabetic.   Past Medical History  Diagnosis Date  . Hypertension    History reviewed. No pertinent past surgical history. No family history on file. Social History  Substance Use Topics  . Smoking status: Never Smoker   . Smokeless tobacco: None  . Alcohol Use: No   OB History    No data available     Review of Systems  Constitutional: Positive for fatigue.  Skin: Positive for wound.  Neurological: Positive for light-headedness.      Allergies  Review of patient's allergies indicates no known allergies.  Home Medications   Prior to Admission medications   Medication Sig Start Date End Date Taking? Authorizing Provider  aspirin 81 MG tablet Take 81 mg by mouth daily.   Yes Historical Provider, MD  metoprolol succinate  (TOPROL-XL) 50 MG 24 hr tablet Take 50 mg by mouth daily. Take with or immediately following a meal.   Yes Historical Provider, MD  olmesartan-hydrochlorothiazide (BENICAR HCT) 40-25 MG tablet Take 1 tablet by mouth daily. 09/03/15  Yes Historical Provider, MD   BP 129/98 mmHg  Pulse 109  Temp(Src) 98.4 F (36.9 C) (Oral)  Resp 14  Ht 5\' 6"  (1.676 m)  Wt 144.697 kg  BMI 51.51 kg/m2  SpO2 97%  LMP  (Approximate) Physical Exam  Constitutional: No distress.  HENT:  Head: Normocephalic.  Eyes: Pupils are equal, round, and reactive to light.  Neck: Normal range of motion. Neck supple.  Cardiovascular: Normal rate and regular rhythm.   Pulmonary/Chest: Effort normal and breath sounds normal.  Abdominal: Soft. Bowel sounds are normal. She exhibits no distension. There is no tenderness.  Skin:  Extensive indurated, warm, red right inguinal fold. Areas of induration outlined in skin marker. Area of skin breakdown draining. With fluid in the inguinal crease. Palpation of skin medial will express bubbles of air and purulence from the wound, one small bulla  Nursing note and vitals reviewed.   ED Course  Procedures (including critical care time) Labs Review Labs Reviewed  COMPREHENSIVE METABOLIC PANEL - Abnormal; Notable for the following:    Potassium 2.9 (*)    Chloride 97 (*)    CO2 18 (*)    Glucose, Bld 136 (*)    BUN 51 (*)    Creatinine, Ser 4.50 (*)    Calcium 8.6 (*)  Albumin 2.5 (*)    AST 43 (*)    Total Bilirubin 1.7 (*)    GFR calc non Af Amer 10 (*)    GFR calc Af Amer 11 (*)    Anion gap 21 (*)    All other components within normal limits  CBC WITH DIFFERENTIAL/PLATELET - Abnormal; Notable for the following:    WBC 24.3 (*)    HCT 35.4 (*)    Neutro Abs 21.0 (*)    Monocytes Absolute 2.2 (*)    All other components within normal limits  I-STAT CG4 LACTIC ACID, ED - Abnormal; Notable for the following:    Lactic Acid, Venous 6.09 (*)    All other components  within normal limits  CULTURE, BLOOD (ROUTINE X 2)  CULTURE, BLOOD (ROUTINE X 2)  URINE CULTURE  URINALYSIS, ROUTINE W REFLEX MICROSCOPIC (NOT AT [facility])  I-STAT CG4 LACTIC ACID, ED    Imaging Review Ct Abdomen Pelvis Wo Contrast  09/15/2015  CLINICAL DATA:  Acute abdominal pain. EXAM: CT ABDOMEN AND PELVIS WITHOUT CONTRAST TECHNIQUE: Multidetector CT imaging of the abdomen and pelvis was performed following the standard protocol without IV contrast. COMPARISON:  None. FINDINGS: The lung bases are clear. Diffuse fatty infiltration of the liver. The unenhanced appearance of the gallbladder, pancreas, spleen, adrenal glands, kidneys, abdominal aorta, inferior vena cava, and retroperitoneal lymph nodes is unremarkable. The stomach, small bowel, and colon are decompressed. Small periumbilical hernia containing fat. No free air or free fluid in the abdomen. Pelvis: The appendix is normal. Uterus and ovaries are not enlarged. Intrauterine device is present. 2 cm lesion in the left ovary contains macroscopic fat likely representing a dermoid cyst. No pelvic mass or lymphadenopathy. There is infiltration throughout the subcutaneous fat of the low pelvis, extending over the right hip and groin region. There is prominent soft tissue gas. No discrete abscess. Appearance is consistent with infection with gas producing organism. Prominent lymph nodes in the right groin region are likely reactive. Degenerative changes in the spine. No destructive bone lesions. IMPRESSION: Infiltration and gas in the soft tissues of the low right pelvis extending over the right hip and groin region. Changes are consistent with necrotizing fasciitis due to gas-forming organism. No discrete abscess. Diffuse fatty infiltration of the liver. Small dermoid cyst in the left ovary. These results were called by telephone at the time of interpretation on 09/15/2015 at 10:18 pm to Dr. Erskine Emery , who verbally acknowledged these results.  Electronically Signed   By: Burman Nieves M.D.   On: 09/15/2015 22:20   I have personally reviewed and evaluated these images and lab results as part of my medical decision-making.   EKG Interpretation None      MDM   Final diagnoses:  Necrotizing fasciitis [facility])   Patient presents with necrotizing fasciitis. On arrival, the patient is ambulatory, appears well, and has normal vital signs. She was noted to help a mild tachycardia, but is afebrile, not hypotensive, though she did have orthostatic hypotension noted at urgent care.  On exam the patient has extensive area of cellulitis, draining pus, and with palpation along the area of cellulitis, he seemed to be able to express purulence and bubbles of air, highly concerned for necrotizing fasciitis.  Labs show uremia, acute renal failure, profound metabolic acidosis with elevated lactate. Patient has a leukocytosis with bandemia,  10:43 PM Spoke with Dr. Luisa Hart from general surgery, plans to evaluate patient and take her emergently to the OR.  I anticipate  this patient will need ICU care postoperatively, discussed case with Dr. Vaughan BastaSummer from [facility], plan for ICU admission postoperatively.  Erskine Emeryhris Cosimo Schertzer, MD 09/15/15 [id number]  Rolland PorterMark James, MD 09/15/15 2352

## 2015-09-15 NOTE — ED Notes (Signed)
Dr. Donnald GarrePfeiffer ( EDP ) notified on pt.'s arrival at triage .

## 2015-09-15 NOTE — Anesthesia Preprocedure Evaluation (Addendum)
Anesthesia Evaluation  Patient identified by MRN, date of birth, ID band Patient awake    Reviewed: Allergy & Precautions, NPO status , Patient's Chart, lab work & pertinent test results, reviewed documented beta blocker date and time   Airway Mallampati: I  TM Distance: >3 FB Neck ROM: Full    Dental  (+) Teeth Intact, Dental Advisory Given   Pulmonary    breath sounds clear to auscultation       Cardiovascular hypertension, Pt. on medications and Pt. on home beta blockers  Rhythm:Regular Rate:Normal     Neuro/Psych    GI/Hepatic   Endo/Other  Morbid obesity  Renal/GU      Musculoskeletal   Abdominal   Peds  Hematology   Anesthesia Other Findings Groin gangrene right  Reproductive/Obstetrics                            Anesthesia Physical Anesthesia Plan  ASA: III and emergent  Anesthesia Plan: General   Post-op Pain Management:    Induction: Intravenous, Rapid sequence and Cricoid pressure planned  Airway Management Planned: Oral ETT  Additional Equipment:   Intra-op Plan:   Post-operative Plan: Extubation in OR  Informed Consent: I have reviewed the patients History and Physical, chart, labs and discussed the procedure including the risks, benefits and alternatives for the proposed anesthesia with the patient or authorized representative who has indicated his/her understanding and acceptance.   Dental advisory given  Plan Discussed with: CRNA, Anesthesiologist and Surgeon  Anesthesia Plan Comments:         Anesthesia Quick Evaluation

## 2015-09-15 NOTE — ED Notes (Signed)
Pt. reports worsening abscess at right upper groin area with swelling and malodorous drainage onset 5 days ago  , occasional diarrhea and nausea , poor appetite , generalized weakness and fatigue . Denies fever or chills.

## 2015-09-15 NOTE — ED Provider Notes (Addendum)
HPI  SUBJECTIVE:  Annette Walker is a 65 y.o. female who presents with constant,generalized weakness , decreased appetite, diarrhea reports 3 watery, nonbloody stools per day, body aches, nausea for the past 5 days. States that she has not eaten in 3 days. She reports a "boil" that started draining foul-smelling material 5 days ago. Reports erythema, swelling of gradually increasing size in her right inguinal region. reports chills, but no fevers. She also reports nasal congestion, sinus pain/pressure fullness, but no ear pain, upper dental pain. The headache is not associated with bending for lying down. She denies chest pain, short of breath, palpitations, presyncope, syncope. She has been using nasal steroids and ibuprofen which help with the sinus issues but are not affecting the weakness. There are no aggravating factors. No antipyretics in the past 6-8 hours.Patient works in a hospital. Past medical history of hypertension, no change in medications, GERD. No history of MRSA, diabetes. PMD: Dr. Jamal CollinAlberry   Past Medical History  Diagnosis Date  . Hypertension     History reviewed. No pertinent past surgical history.  History reviewed. No pertinent family history.  Social History  Substance Use Topics  . Smoking status: Never Smoker   . Smokeless tobacco: None  . Alcohol Use: No    No current facility-administered medications for this encounter.  Current outpatient prescriptions:  .  aspirin 81 MG tablet, Take 81 mg by mouth daily., Disp: , Rfl:  .  metoprolol succinate (TOPROL-XL) 50 MG 24 hr tablet, Take 50 mg by mouth daily. Take with or immediately following a meal., Disp: , Rfl:  .  olmesartan (BENICAR) 20 MG tablet, Take 20 mg by mouth daily., Disp: , Rfl:   No Known Allergies   ROS  As noted in HPI.   Physical Exam  Temp(Src) 99.8 F (37.7 C) (Oral)  SpO2 98%  LMP  (Approximate)   Orthostatic VS for the past 24 hrs:  BP- Lying Pulse- Lying BP- Sitting Pulse-  Sitting BP- Standing at 0 minutes Pulse- Standing at 0 minutes  09/15/15 1735 103/66 mmHg 118 102/76 mmHg 122 (!) 85/57 mmHg 125      Constitutional: Well developed, well nourished, no acute distress Eyes: PERRL, EOMI, conjunctiva normal bilaterally HENT: Normocephalic, atraumatic,mucus membranes moist. TMs normal. No nasal congestion. Normal turbinates. Positive maxillary sinus  tenderness. Normal oropharynx. Respiratory: Clear to auscultation bilaterally, no rales, no wheezing, no rhonchi Cardiovascular: tachycardic, no murmurs, no gallops, no rubs GI: Soft, nondistended, normal bowel sounds, nontender, no rebound, no guarding skin: large tender erythematous area right inguinal region draining foul-smelling brownish drainage. Measures approximately 29 x 10 cm. See picture. No crepitus.    Musculoskeletal: No edema, no tenderness, no deformities Neurologic: Alert & oriented x 3, CN II-XII grossly intact, no motor deficits, sensation grossly intact Psychiatric: Speech and behavior appropriate   ED Course   Medications - No data to display  No orders of the defined types were placed in this encounter.   No results found for this or any previous visit (from the past 24 hour(s)). No results found.  ED Clinical Impression  Weakness  Orthostatic hypotension  Abscess   ED Assessment/Plan  Patient with a severe skin infection. In the differential is Fournier's gangrene. she is orthostatic, could be dehydration from the diarrhea and the fact that she has not eaten in 3 days. However, it could also be early sepsis. patient may need surgery evaluation, IV fluids and IV antibiotics. ED was notified. Transferring via shuttle.  *This  clinic note was created using Scientist, clinical (histocompatibility and immunogenetics). Therefore, there may be occasional mistakes despite careful proofreading.  ?  Domenick Gong, MD 09/15/15 1844  Domenick Gong, MD 09/15/15 [id number]

## 2015-09-15 NOTE — Discharge Instructions (Signed)
Go immediately to the ER 

## 2015-09-15 NOTE — ED Notes (Signed)
[facility] a [facility] of bilateral ear pain, sinus pressure, loss of appetite and general weakness x 3 days. The patient stated that she has not eaten in 3 days.

## 2015-09-15 NOTE — Consult Note (Signed)
Reason for Consult:draining abdominal wound Referring Physician: Tanna Furry MD  Annette Walker is an 65 y.o. female.  HPI: ASKED TO SEE PATIENT at the request of Dr Jeneen Rinks for 3 days hx of abdominal pain and open draining wound from abdomen.  She has had chills.  No N/V.  CT shows SQ gas and panniculitis with necrotizing infection.    Past Medical History  Diagnosis Date  . Hypertension     History reviewed. No pertinent past surgical history.  No family history on file.  Social History:  reports that she has never smoked. She does not have any smokeless tobacco history on file. She reports that she does not drink alcohol or use illicit drugs.  Allergies: No Known Allergies  Medications: I have reviewed the patient's current medications.  Results for orders placed or performed during the hospital encounter of 09/15/15 (from the past 48 hour(s))  Comprehensive metabolic panel     Status: Abnormal   Collection Time: 09/15/15  7:29 PM  Result Value Ref Range   Sodium 136 135 - 145 mmol/L   Potassium 2.9 (L) 3.5 - 5.1 mmol/L   Chloride 97 (L) 101 - 111 mmol/L   CO2 18 (L) 22 - 32 mmol/L   Glucose, Bld 136 (H) 65 - 99 mg/dL   BUN 51 (H) 6 - 20 mg/dL   Creatinine, Ser 4.50 (H) 0.44 - 1.00 mg/dL   Calcium 8.6 (L) 8.9 - 10.3 mg/dL   Total Protein 8.0 6.5 - 8.1 g/dL   Albumin 2.5 (L) 3.5 - 5.0 g/dL   AST 43 (H) 15 - 41 U/L   ALT 23 14 - 54 U/L   Alkaline Phosphatase 73 38 - 126 U/L   Total Bilirubin 1.7 (H) 0.3 - 1.2 mg/dL   GFR calc non Af Amer 10 (L) >60 mL/min   GFR calc Af Amer 11 (L) >60 mL/min    Comment: (NOTE) The eGFR has been calculated using the CKD EPI equation. This calculation has not been validated in all clinical situations. eGFR's persistently <60 mL/min signify possible Chronic Kidney Disease.    Anion gap 21 (H) 5 - 15  CBC with Differential     Status: Abnormal   Collection Time: 09/15/15  7:29 PM  Result Value Ref Range   WBC 24.3 (H) 4.0 - 10.5 K/uL   RBC 4.41 3.87 - 5.11 MIL/uL   Hemoglobin 12.1 12.0 - 15.0 g/dL   HCT 35.4 (L) 36.0 - 46.0 %   MCV 80.3 78.0 - 100.0 fL   MCH 27.4 26.0 - 34.0 pg   MCHC 34.2 30.0 - 36.0 g/dL   RDW 14.5 11.5 - 15.5 %   Platelets 254 150 - 400 K/uL   Neutrophils Relative % 87 %   Neutro Abs 21.0 (H) 1.7 - 7.7 K/uL   Lymphocytes Relative 4 %   Lymphs Abs 1.0 0.7 - 4.0 K/uL   Monocytes Relative 9 %   Monocytes Absolute 2.2 (H) 0.1 - 1.0 K/uL   Eosinophils Relative 0 %   Eosinophils Absolute 0.0 0.0 - 0.7 K/uL   Basophils Relative 0 %   Basophils Absolute 0.0 0.0 - 0.1 K/uL  I-Stat CG4 Lactic Acid, ED     Status: Abnormal   Collection Time: 09/15/15  7:46 PM  Result Value Ref Range   Lactic Acid, Venous 6.09 (HH) 0.5 - 2.0 mmol/L   Comment NOTIFIED PHYSICIAN     Ct Abdomen Pelvis Wo Contrast  09/15/2015  CLINICAL DATA:  Acute abdominal pain. EXAM: CT ABDOMEN AND PELVIS WITHOUT CONTRAST TECHNIQUE: Multidetector CT imaging of the abdomen and pelvis was performed following the standard protocol without IV contrast. COMPARISON:  None. FINDINGS: The lung bases are clear. Diffuse fatty infiltration of the liver. The unenhanced appearance of the gallbladder, pancreas, spleen, adrenal glands, kidneys, abdominal aorta, inferior vena cava, and retroperitoneal lymph nodes is unremarkable. The stomach, small bowel, and colon are decompressed. Small periumbilical hernia containing fat. No free air or free fluid in the abdomen. Pelvis: The appendix is normal. Uterus and ovaries are not enlarged. Intrauterine device is present. 2 cm lesion in the left ovary contains macroscopic fat likely representing a dermoid cyst. No pelvic mass or lymphadenopathy. There is infiltration throughout the subcutaneous fat of the low pelvis, extending over the right hip and groin region. There is prominent soft tissue gas. No discrete abscess. Appearance is consistent with infection with gas producing organism. Prominent lymph nodes in the right  groin region are likely reactive. Degenerative changes in the spine. No destructive bone lesions. IMPRESSION: Infiltration and gas in the soft tissues of the low right pelvis extending over the right hip and groin region. Changes are consistent with necrotizing fasciitis due to gas-forming organism. No discrete abscess. Diffuse fatty infiltration of the liver. Small dermoid cyst in the left ovary. These results were called by telephone at the time of interpretation on 09/15/2015 at 10:18 pm to Dr. Leata Mouse , who verbally acknowledged these results. Electronically Signed   By: Lucienne Capers M.D.   On: 09/15/2015 22:20    Review of Systems  Constitutional: Positive for fever.  HENT: Negative.   Eyes: Negative.   Respiratory: Negative.   Gastrointestinal: Positive for abdominal pain.  Genitourinary: Negative.   Musculoskeletal: Negative.   Neurological: Positive for weakness.  Endo/Heme/Allergies: Negative.   Psychiatric/Behavioral: Negative.    Blood pressure 93/48, pulse 108, temperature 98.4 F (36.9 C), temperature source Oral, resp. rate 14, height 5' 6"  (1.676 m), weight 144.697 kg (319 lb), SpO2 99 %. Physical Exam  Constitutional: She is oriented to person, place, and time. She appears well-developed.  Eyes: Pupils are equal, round, and reactive to light.  Neck: Normal range of motion.  Cardiovascular: Normal rate.   Respiratory: Effort normal.  GI: There is tenderness.    Musculoskeletal: Normal range of motion.  Neurological: She is alert and oriented to person, place, and time.  Skin: There is erythema.     Psychiatric: She has a normal mood and affect. Her behavior is normal. Thought content normal.    Assessment/Plan: Necrotizing fasciitis abdominal wall Sepsis ARI Hypokalemia CCM consulted for ICU care post op    Will take to OR emergently     Pt needs emergent debridement of abdominal wall  The procedure has been discussed with the patient.  Alternative  therapies have been discussed with the patient.  Operative risks include bleeding,  Infection,  Organ injury,  Nerve injury,  Blood vessel injury,  DVT,  Pulmonary embolism,  Death,  And possible reoperation.  Medical management risks include worsening of present situation.  The success of the procedure is 50 -90 % at treating patients symptoms.  The patient understands and agrees to proceed.  Amando Chaput A. 09/15/2015, 10:54 PM

## 2015-09-15 NOTE — ED Notes (Signed)
Second IV attempted x 3 without success

## 2015-09-15 NOTE — ED Notes (Signed)
Surgeon and CCM at bedside

## 2015-09-16 ENCOUNTER — Inpatient Hospital Stay (HOSPITAL_COMMUNITY): Payer: 59

## 2015-09-16 ENCOUNTER — Emergency Department (HOSPITAL_COMMUNITY): Payer: 59 | Admitting: Certified Registered"

## 2015-09-16 DIAGNOSIS — J9601 Acute respiratory failure with hypoxia: Secondary | ICD-10-CM | POA: Diagnosis not present

## 2015-09-16 DIAGNOSIS — L02214 Cutaneous abscess of groin: Secondary | ICD-10-CM | POA: Diagnosis present

## 2015-09-16 DIAGNOSIS — J9690 Respiratory failure, unspecified, unspecified whether with hypoxia or hypercapnia: Secondary | ICD-10-CM | POA: Diagnosis not present

## 2015-09-16 DIAGNOSIS — L0889 Other specified local infections of the skin and subcutaneous tissue: Secondary | ICD-10-CM | POA: Diagnosis not present

## 2015-09-16 DIAGNOSIS — E872 Acidosis, unspecified: Secondary | ICD-10-CM | POA: Insufficient documentation

## 2015-09-16 DIAGNOSIS — Z7982 Long term (current) use of aspirin: Secondary | ICD-10-CM | POA: Diagnosis not present

## 2015-09-16 DIAGNOSIS — E876 Hypokalemia: Secondary | ICD-10-CM | POA: Diagnosis present

## 2015-09-16 DIAGNOSIS — M726 Necrotizing fasciitis: Secondary | ICD-10-CM | POA: Diagnosis present

## 2015-09-16 DIAGNOSIS — T8189XA Other complications of procedures, not elsewhere classified, initial encounter: Secondary | ICD-10-CM | POA: Diagnosis not present

## 2015-09-16 DIAGNOSIS — R6521 Severe sepsis with septic shock: Secondary | ICD-10-CM | POA: Diagnosis present

## 2015-09-16 DIAGNOSIS — R000 Tachycardia, unspecified: Secondary | ICD-10-CM | POA: Diagnosis present

## 2015-09-16 DIAGNOSIS — I951 Orthostatic hypotension: Secondary | ICD-10-CM | POA: Diagnosis present

## 2015-09-16 DIAGNOSIS — D509 Iron deficiency anemia, unspecified: Secondary | ICD-10-CM | POA: Diagnosis present

## 2015-09-16 DIAGNOSIS — Z6841 Body Mass Index (BMI) 40.0 and over, adult: Secondary | ICD-10-CM | POA: Diagnosis not present

## 2015-09-16 DIAGNOSIS — R100 Acute abdomen: Secondary | ICD-10-CM | POA: Diagnosis not present

## 2015-09-16 DIAGNOSIS — E869 Volume depletion, unspecified: Secondary | ICD-10-CM | POA: Diagnosis present

## 2015-09-16 DIAGNOSIS — N179 Acute kidney failure, unspecified: Secondary | ICD-10-CM | POA: Insufficient documentation

## 2015-09-16 DIAGNOSIS — A419 Sepsis, unspecified organism: Secondary | ICD-10-CM | POA: Diagnosis present

## 2015-09-16 DIAGNOSIS — I10 Essential (primary) hypertension: Secondary | ICD-10-CM | POA: Diagnosis present

## 2015-09-16 DIAGNOSIS — J95821 Acute postprocedural respiratory failure: Secondary | ICD-10-CM | POA: Diagnosis not present

## 2015-09-16 DIAGNOSIS — R109 Unspecified abdominal pain: Secondary | ICD-10-CM | POA: Insufficient documentation

## 2015-09-16 DIAGNOSIS — G92 Toxic encephalopathy: Secondary | ICD-10-CM | POA: Diagnosis not present

## 2015-09-16 DIAGNOSIS — S31109D Unspecified open wound of abdominal wall, unspecified quadrant without penetration into peritoneal cavity, subsequent encounter: Secondary | ICD-10-CM | POA: Diagnosis not present

## 2015-09-16 DIAGNOSIS — S31109A Unspecified open wound of abdominal wall, unspecified quadrant without penetration into peritoneal cavity, initial encounter: Secondary | ICD-10-CM | POA: Diagnosis present

## 2015-09-16 LAB — URINE MICROSCOPIC-ADD ON: WBC UA: NONE SEEN WBC/hpf (ref 0–5)

## 2015-09-16 LAB — COMPREHENSIVE METABOLIC PANEL
ALBUMIN: 1.9 g/dL — AB (ref 3.5–5.0)
ALK PHOS: 57 U/L (ref 38–126)
ALT: 21 U/L (ref 14–54)
AST: 37 U/L (ref 15–41)
Anion gap: 12 (ref 5–15)
BUN: 42 mg/dL — ABNORMAL HIGH (ref 6–20)
CALCIUM: 7.6 mg/dL — AB (ref 8.9–10.3)
CO2: 20 mmol/L — AB (ref 22–32)
CREATININE: 3.07 mg/dL — AB (ref 0.44–1.00)
Chloride: 110 mmol/L (ref 101–111)
GFR calc Af Amer: 18 mL/min — ABNORMAL LOW (ref >60)
GFR calc non Af Amer: 15 mL/min — ABNORMAL LOW (ref >60)
GLUCOSE: 110 mg/dL — AB (ref 65–99)
Potassium: 3.3 mmol/L — ABNORMAL LOW (ref 3.5–5.1)
SODIUM: 142 mmol/L (ref 135–145)
Total Bilirubin: 1.9 mg/dL — ABNORMAL HIGH (ref 0.3–1.2)
Total Protein: 6.5 g/dL (ref 6.5–8.1)

## 2015-09-16 LAB — URINALYSIS, ROUTINE W REFLEX MICROSCOPIC
BILIRUBIN URINE: NEGATIVE
Glucose, UA: NEGATIVE mg/dL
KETONES UR: NEGATIVE mg/dL
Leukocytes, UA: NEGATIVE
Nitrite: NEGATIVE
PROTEIN: NEGATIVE mg/dL
Specific Gravity, Urine: 1.015 (ref 1.005–1.030)
pH: 5 (ref 5.0–8.0)

## 2015-09-16 LAB — GLUCOSE, CAPILLARY
GLUCOSE-CAPILLARY: 98 mg/dL (ref 65–99)
Glucose-Capillary: 114 mg/dL — ABNORMAL HIGH (ref 65–99)

## 2015-09-16 LAB — CBC
HCT: 29.8 % — ABNORMAL LOW (ref 36.0–46.0)
Hemoglobin: 10.3 g/dL — ABNORMAL LOW (ref 12.0–15.0)
MCH: 27 pg (ref 26.0–34.0)
MCHC: 34.6 g/dL (ref 30.0–36.0)
MCV: 78.2 fL (ref 78.0–100.0)
PLATELETS: 204 10*3/uL (ref 150–400)
RBC: 3.81 MIL/uL — ABNORMAL LOW (ref 3.87–5.11)
RDW: 14.5 % (ref 11.5–15.5)
WBC: 16.4 10*3/uL — ABNORMAL HIGH (ref 4.0–10.5)

## 2015-09-16 LAB — POCT I-STAT 3, ART BLOOD GAS (G3+)
Acid-base deficit: 7 mmol/L — ABNORMAL HIGH (ref 0.0–2.0)
BICARBONATE: 19.6 meq/L — AB (ref 20.0–24.0)
O2 Saturation: 99 %
PCO2 ART: 40.5 mmHg (ref 35.0–45.0)
PO2 ART: 131 mmHg — AB (ref 80.0–100.0)
TCO2: 21 mmol/L (ref 0–100)
pH, Arterial: 7.292 — ABNORMAL LOW (ref 7.350–7.450)

## 2015-09-16 LAB — TRIGLYCERIDES: Triglycerides: 164 mg/dL — ABNORMAL HIGH (ref <150)

## 2015-09-16 LAB — PROCALCITONIN: PROCALCITONIN: 1.37 ng/mL

## 2015-09-16 LAB — PHOSPHORUS: PHOSPHORUS: 4.2 mg/dL (ref 2.5–4.6)

## 2015-09-16 LAB — MAGNESIUM: Magnesium: 1.9 mg/dL (ref 1.7–2.4)

## 2015-09-16 LAB — MRSA PCR SCREENING: MRSA by PCR: NEGATIVE

## 2015-09-16 LAB — I-STAT CG4 LACTIC ACID, ED: LACTIC ACID, VENOUS: 3.09 mmol/L — AB (ref 0.5–2.0)

## 2015-09-16 LAB — LACTIC ACID, PLASMA: LACTIC ACID, VENOUS: 2.4 mmol/L — AB (ref 0.5–2.0)

## 2015-09-16 MED ORDER — SODIUM CHLORIDE 0.9 % IV SOLN
INTRAVENOUS | Status: DC
  Administered 2015-09-16 – 2015-09-17 (×3): via INTRAVENOUS
  Administered 2015-09-17: 125 mL/h via INTRAVENOUS
  Administered 2015-09-18 – 2015-09-19 (×2): via INTRAVENOUS

## 2015-09-16 MED ORDER — PANTOPRAZOLE SODIUM 40 MG IV SOLR
40.0000 mg | INTRAVENOUS | Status: DC
  Administered 2015-09-16 – 2015-09-17 (×2): 40 mg via INTRAVENOUS
  Filled 2015-09-16 (×2): qty 40

## 2015-09-16 MED ORDER — ONDANSETRON HCL 4 MG/2ML IJ SOLN: 4.0000 mg | Freq: Four times a day (QID) | INTRAMUSCULAR | Status: DC | PRN

## 2015-09-16 MED ORDER — PROPOFOL 500 MG/50ML IV EMUL
INTRAVENOUS | Status: DC | PRN
  Administered 2015-09-16: 50 ug/kg/min via INTRAVENOUS

## 2015-09-16 MED ORDER — PHENYLEPHRINE HCL 10 MG/ML IJ SOLN
INTRAMUSCULAR | Status: DC | PRN
  Administered 2015-09-16: 80 ug via INTRAVENOUS
  Administered 2015-09-16: 160 ug via INTRAVENOUS
  Administered 2015-09-16: 80 ug via INTRAVENOUS

## 2015-09-16 MED ORDER — ASPIRIN 81 MG PO CHEW
81.0000 mg | CHEWABLE_TABLET | Freq: Every day | ORAL | Status: DC
  Administered 2015-09-16: 81 mg
  Filled 2015-09-16: qty 1

## 2015-09-16 MED ORDER — PROPOFOL 10 MG/ML IV BOLUS
INTRAVENOUS | Status: AC
  Filled 2015-09-16: qty 20

## 2015-09-16 MED ORDER — LIDOCAINE HCL (CARDIAC) 20 MG/ML IV SOLN
INTRAVENOUS | Status: DC | PRN
  Administered 2015-09-16: 100 mg via INTRAVENOUS

## 2015-09-16 MED ORDER — PHENYLEPHRINE 40 MCG/ML (10ML) SYRINGE FOR IV PUSH (FOR BLOOD PRESSURE SUPPORT)
PREFILLED_SYRINGE | INTRAVENOUS | Status: AC
  Filled 2015-09-16: qty 30

## 2015-09-16 MED ORDER — ONDANSETRON 4 MG PO TBDP
4.0000 mg | ORAL_TABLET | Freq: Four times a day (QID) | ORAL | Status: DC | PRN
  Filled 2015-09-16: qty 1

## 2015-09-16 MED ORDER — SUCCINYLCHOLINE CHLORIDE 200 MG/10ML IV SOSY
PREFILLED_SYRINGE | INTRAVENOUS | Status: AC
  Filled 2015-09-16: qty 10

## 2015-09-16 MED ORDER — ROCURONIUM BROMIDE 100 MG/10ML IV SOLN
INTRAVENOUS | Status: DC | PRN
Start: 2015-09-16 — End: 2015-09-16
  Administered 2015-09-16: 50 mg via INTRAVENOUS
  Administered 2015-09-16: 20 mg via INTRAVENOUS
  Administered 2015-09-16: 30 mg via INTRAVENOUS

## 2015-09-16 MED ORDER — OXYCODONE HCL 5 MG PO TABS: 5.0000 mg | ORAL_TABLET | Freq: Once | ORAL | Status: DC | PRN

## 2015-09-16 MED ORDER — CHLORHEXIDINE GLUCONATE 0.12% ORAL RINSE (MEDLINE KIT)
15.0000 mL | Freq: Two times a day (BID) | OROMUCOSAL | Status: DC
  Administered 2015-09-16 (×2): 15 mL via OROMUCOSAL

## 2015-09-16 MED ORDER — OXYCODONE HCL 5 MG/5ML PO SOLN: 5.0000 mg | Freq: Once | ORAL | Status: DC | PRN

## 2015-09-16 MED ORDER — ANTISEPTIC ORAL RINSE SOLUTION (CORINZ)
7.0000 mL | OROMUCOSAL | Status: DC
  Administered 2015-09-16 (×4): 7 mL via OROMUCOSAL

## 2015-09-16 MED ORDER — PROPOFOL 10 MG/ML IV BOLUS
INTRAVENOUS | Status: DC | PRN
  Administered 2015-09-16: 200 mg via INTRAVENOUS

## 2015-09-16 MED ORDER — HYDROMORPHONE HCL 1 MG/ML IJ SOLN: 0.2500 mg | INTRAMUSCULAR | Status: DC | PRN

## 2015-09-16 MED ORDER — FENTANYL CITRATE (PF) 100 MCG/2ML IJ SOLN
25.0000 ug | INTRAMUSCULAR | Status: DC | PRN
  Administered 2015-09-16: 50 ug via INTRAVENOUS
  Administered 2015-09-17 – 2015-09-18 (×3): 100 ug via INTRAVENOUS
  Filled 2015-09-16 (×4): qty 2

## 2015-09-16 MED ORDER — ROCURONIUM BROMIDE 50 MG/5ML IV SOLN
INTRAVENOUS | Status: AC
  Filled 2015-09-16: qty 2

## 2015-09-16 MED ORDER — SUCCINYLCHOLINE CHLORIDE 20 MG/ML IJ SOLN
INTRAMUSCULAR | Status: DC | PRN
  Administered 2015-09-16: 160 mg via INTRAVENOUS

## 2015-09-16 MED ORDER — SODIUM CHLORIDE 0.9 % IR SOLN
Status: DC | PRN
  Administered 2015-09-16: 2000 mL

## 2015-09-16 MED ORDER — 0.9 % SODIUM CHLORIDE (POUR BTL) OPTIME
TOPICAL | Status: DC | PRN
  Administered 2015-09-16: 1000 mL

## 2015-09-16 MED ORDER — FENTANYL CITRATE (PF) 100 MCG/2ML IJ SOLN
INTRAMUSCULAR | Status: DC | PRN
  Administered 2015-09-16: 50 ug via INTRAVENOUS
  Administered 2015-09-16 (×2): 100 ug via INTRAVENOUS

## 2015-09-16 MED ORDER — POTASSIUM CHLORIDE 10 MEQ/100ML IV SOLN
10.0000 meq | INTRAVENOUS | Status: AC
  Administered 2015-09-16 (×2): 10 meq via INTRAVENOUS
  Filled 2015-09-16: qty 100

## 2015-09-16 MED ORDER — HYDROMORPHONE HCL 1 MG/ML IJ SOLN: 1.0000 mg | INTRAMUSCULAR | Status: DC | PRN

## 2015-09-16 MED ORDER — POTASSIUM CHLORIDE 10 MEQ/50ML IV SOLN: 10.0000 meq | INTRAVENOUS | Status: AC

## 2015-09-16 MED ORDER — CLINDAMYCIN PHOSPHATE 900 MG/50ML IV SOLN
900.0000 mg | Freq: Three times a day (TID) | INTRAVENOUS | Status: DC
  Administered 2015-09-16 – 2015-09-21 (×16): 900 mg via INTRAVENOUS
  Filled 2015-09-16 (×21): qty 50

## 2015-09-16 MED ORDER — FENTANYL CITRATE (PF) 100 MCG/2ML IJ SOLN: 25.0000 ug | INTRAMUSCULAR | Status: DC | PRN

## 2015-09-16 MED ORDER — KCL IN DEXTROSE-NACL 20-5-0.9 MEQ/L-%-% IV SOLN
INTRAVENOUS | Status: DC
  Administered 2015-09-16: 02:00:00 via INTRAVENOUS
  Filled 2015-09-16 (×2): qty 1000

## 2015-09-16 MED ORDER — PROPOFOL 500 MG/50ML IV EMUL
INTRAVENOUS | Status: AC
  Filled 2015-09-16: qty 50

## 2015-09-16 MED ORDER — ENOXAPARIN SODIUM 30 MG/0.3ML ~~LOC~~ SOLN: 30.0000 mg | SUBCUTANEOUS | Status: DC

## 2015-09-16 MED ORDER — PIPERACILLIN-TAZOBACTAM 3.375 G IVPB
3.3750 g | Freq: Three times a day (TID) | INTRAVENOUS | Status: DC
  Administered 2015-09-16 – 2015-09-21 (×15): 3.375 g via INTRAVENOUS
  Filled 2015-09-16 (×21): qty 50

## 2015-09-16 MED ORDER — SODIUM CHLORIDE 0.9 % IV SOLN
INTRAVENOUS | Status: DC | PRN
Start: 2015-09-16 — End: 2015-09-16
  Administered 2015-09-16 (×4): via INTRAVENOUS

## 2015-09-16 MED ORDER — MIDAZOLAM HCL 2 MG/2ML IJ SOLN
INTRAMUSCULAR | Status: AC
  Filled 2015-09-16: qty 2

## 2015-09-16 MED ORDER — PROPOFOL 1000 MG/100ML IV EMUL
5.0000 ug/kg/min | INTRAVENOUS | Status: DC
  Administered 2015-09-16: 10 ug/kg/min via INTRAVENOUS

## 2015-09-16 MED ORDER — PIPERACILLIN-TAZOBACTAM 3.375 G IVPB: 3.3750 g | Freq: Three times a day (TID) | INTRAVENOUS | Status: DC

## 2015-09-16 MED ORDER — MEPERIDINE HCL 25 MG/ML IJ SOLN: 6.2500 mg | INTRAMUSCULAR | Status: DC | PRN

## 2015-09-16 MED ORDER — HEPARIN SODIUM (PORCINE) 5000 UNIT/ML IJ SOLN: 5000.0000 [IU] | Freq: Three times a day (TID) | INTRAMUSCULAR | Status: DC

## 2015-09-16 MED ORDER — HEPARIN SODIUM (PORCINE) 5000 UNIT/ML IJ SOLN
5000.0000 [IU] | Freq: Three times a day (TID) | INTRAMUSCULAR | Status: DC
  Administered 2015-09-17 – 2015-09-20 (×11): 5000 [IU] via SUBCUTANEOUS
  Filled 2015-09-16 (×11): qty 1

## 2015-09-16 MED ORDER — MIDAZOLAM HCL 5 MG/5ML IJ SOLN
INTRAMUSCULAR | Status: DC | PRN
  Administered 2015-09-16 (×2): 2 mg via INTRAVENOUS

## 2015-09-16 NOTE — Anesthesia Procedure Notes (Signed)
Procedure Name: Intubation Date/Time: 09/16/2015 12:30 AM Performed by: Arlice ColtMANESS, Ladonna Vanorder B Pre-anesthesia Checklist: Patient identified, Emergency Drugs available, Suction available, Patient being monitored and Timeout performed Patient Re-evaluated:Patient Re-evaluated prior to inductionOxygen Delivery Method: Circle system utilized Preoxygenation: Pre-oxygenation with 100% oxygen Intubation Type: IV induction and Rapid sequence Laryngoscope Size: Mac and 3 Grade View: Grade I Tube type: Oral Tube size: 7.5 mm Number of attempts: 1 Airway Equipment and Method: Stylet Placement Confirmation: ETT inserted through vocal cords under direct vision,  positive ETCO2 and breath sounds checked- equal and bilateral Secured at: 22 cm Tube secured with: Tape Dental Injury: Teeth and Oropharynx as per pre-operative assessment

## 2015-09-16 NOTE — Progress Notes (Signed)
eLink Physician-Brief Progress Note Patient Name: Annette Walker DOB: March 13, 1951 MRN: [id number]   Date of Service  09/16/2015  HPI/Events of Note  Camera check on patient after arrival from operating room And preceding intubation 574for necrotizing fasciitis of her pannus. Patient returned to ICU intubated. Currently normotensive. Nursing staff at bedside.  eICU Interventions  1. Ventilator orders placed 2. Stat ABG & portable chest x-ray 3. Propofol drip & fentanyl ordered IV when necessary 4. Patient to be assessed by PCCM     Intervention Category Major Interventions: Sepsis - evaluation and management;Respiratory failure - evaluation and management  Annette Walker 09/16/2015, 2:18 AM

## 2015-09-16 NOTE — Progress Notes (Signed)
1 Day Post-Op  Subjective: Patient intubated, but awake and alert Indicates minimal pain Weaning sedation - CCM to hopefully extubate later today  Objective: Vital signs in last 24 hours: Temp:  [97.1 F (36.2 C)-99.8 F (37.7 C)] 98.5 F (36.9 C) (05/03 0448) Pulse Rate:  [64-109] 73 (05/03 0755) Resp:  [10-24] 21 (05/03 0755) BP: (81-129)/(48-98) 98/50 mmHg (05/03 0755) SpO2:  [97 %-100 %] 100 % (05/03 0755) FiO2 (%):  [50 %-100 %] 50 % (05/03 0755) Weight:  [144.697 kg (319 lb)] 144.697 kg (319 lb) (05/02 1916) Last BM Date:  (PTA)  Intake/Output from previous day: 05/02 0701 - 05/03 0700 In: 4598.5 [I.V.:4548.5; IV Piggyback:50] Out: 1350 [Urine:1250; Blood:100] Intake/Output this shift:    General appearance: alert, cooperative and no distress Abd - soft, minimal tenderness around wound; Wound is clean, no necrotic tissue  Lab Results:   Recent Labs  09/15/15 1929 09/16/15 0736  WBC 24.3* 16.4*  HGB 12.1 10.3*  HCT 35.4* 29.8*  PLT 254 204   BMET  Recent Labs  09/15/15 1929  NA 136  K 2.9*  CL 97*  CO2 18*  GLUCOSE 136*  BUN 51*  CREATININE 4.50*  CALCIUM 8.6*   PT/INR No results for input(s): LABPROT, INR in the last 72 hours. ABG  Recent Labs  09/16/15 0348  PHART 7.292*  HCO3 19.6*    Studies/Results: Ct Abdomen Pelvis Wo Contrast  09/15/2015  CLINICAL DATA:  Acute abdominal pain. EXAM: CT ABDOMEN AND PELVIS WITHOUT CONTRAST TECHNIQUE: Multidetector CT imaging of the abdomen and pelvis was performed following the standard protocol without IV contrast. COMPARISON:  None. FINDINGS: The lung bases are clear. Diffuse fatty infiltration of the liver. The unenhanced appearance of the gallbladder, pancreas, spleen, adrenal glands, kidneys, abdominal aorta, inferior vena cava, and retroperitoneal lymph nodes is unremarkable. The stomach, small bowel, and colon are decompressed. Small periumbilical hernia containing fat. No free air or free fluid in  the abdomen. Pelvis: The appendix is normal. Uterus and ovaries are not enlarged. Intrauterine device is present. 2 cm lesion in the left ovary contains macroscopic fat likely representing a dermoid cyst. No pelvic mass or lymphadenopathy. There is infiltration throughout the subcutaneous fat of the low pelvis, extending over the right hip and groin region. There is prominent soft tissue gas. No discrete abscess. Appearance is consistent with infection with gas producing organism. Prominent lymph nodes in the right groin region are likely reactive. Degenerative changes in the spine. No destructive bone lesions. IMPRESSION: Infiltration and gas in the soft tissues of the low right pelvis extending over the right hip and groin region. Changes are consistent with necrotizing fasciitis due to gas-forming organism. No discrete abscess. Diffuse fatty infiltration of the liver. Small dermoid cyst in the left ovary. These results were called by telephone at the time of interpretation on 09/15/2015 at 10:18 pm to Dr. Erskine Emery , who verbally acknowledged these results. Electronically Signed   By: Burman Nieves M.D.   On: 09/15/2015 22:20   Dg Chest Port 1 View  09/16/2015  CLINICAL DATA:  Respiratory failure EXAM: PORTABLE CHEST 1 VIEW COMPARISON:  02/02/2010 FINDINGS: Endotracheal tube is 2 cm above the carina. Patchy opacity in the central lungs and upper lobes may represent pneumonia. Pulmonary hemorrhage could also have this appearance. No large effusions. No pneumothorax. IMPRESSION: Satisfactory ET tube position. Patchy central and upper lobe opacities bilaterally, possibly pneumonia. Electronically Signed   By: Ellery Plunk M.D.   On: 09/16/2015 02:37  Anti-infectives: Anti-infectives    Start     Dose/Rate Route Frequency Ordered Stop   09/16/15 0400  clindamycin (CLEOCIN) IVPB 900 mg     900 mg 100 mL/hr over 30 Minutes Intravenous Every 8 hours 09/16/15 0249     09/16/15 0215   piperacillin-tazobactam (ZOSYN) IVPB 3.375 g  Status:  Discontinued     3.375 g 12.5 mL/hr over 240 Minutes Intravenous Every 8 hours 09/16/15 0207 09/16/15 0210   09/16/15 0042  piperacillin-tazobactam (ZOSYN) IVPB 2.25 g     2.25 g 100 mL/hr over 30 Minutes Intravenous Every 8 hours 09/15/15 2048     09/15/15 2130  clindamycin (CLEOCIN) IVPB 600 mg     600 mg 100 mL/hr over 30 Minutes Intravenous  Once 09/15/15 2120 09/16/15 0041   09/15/15 2030  piperacillin-tazobactam (ZOSYN) IVPB 3.375 g     3.375 g 100 mL/hr over 30 Minutes Intravenous STAT 09/15/15 2026 09/15/15 2236   09/15/15 2030  vancomycin (VANCOCIN) 2,500 mg in sodium chloride 0.9 % 500 mL IVPB     2,500 mg 250 mL/hr over 120 Minutes Intravenous  Once 09/15/15 2026 09/15/15 2310      Assessment/Plan: s/p Procedure(s): IRRIGATION AND DEBRIDEMENT ABDOMEN (N/A) Necrotizing soft tissue infection (not fasciitis)   Begin dressing changes BID Vent wean per CCM  LOS: 0 days    Neeko Pharo K. 09/16/2015

## 2015-09-16 NOTE — Op Note (Signed)
.    Progress note or procedure note with a detailed description of the procedure.   Preoperative diagnosis: Necrotizing fasciitis of lower abdominal wall and pannus  Postop diagnosis: Same  Procedure: Debridement of skin and subcutaneous tissue lower abdominal wall measuring 22 cm x 8 cm x 4 cm  Surgeon: Harriette Bouillonhomas Adrionna Delcid M.D.  Anesthesia: Gen.  EBL: Minimal  Specimen: Skin and subcutaneous fat and cultures taken  Indications for procedure: The patient is a 65 year old female with a four-day history of abdominal pain, swelling, and draining wound involving her lower right abdominal wall and pannus. She was seen tonight in the emergency room and CT scan revealed subcutaneous air and findings worrisome for necrotizing soft tissue infection. She had a white count of 24,000, and was in acute renal failure with a lactic acidosis. I recommended emergent debridement secondary to infection. She was seen by CCM will be admitted to their service for postoperative ICU care.    Description of procedure: The patient was seen in the emergency room and then brought up to the holding room. She had an area involving her right lower pannus and over her mons pubis measuring 22 cm. This was opened and draining with necrotic fluctuant tissue and subcutaneous air. She was taken back to the operating room where she was intubated after discussion of the procedure the pros and cons of doing this. We explained this could be a life-saving procedures and she has signs of multicystic organ failure with a necrotizing infection. Risk of bleeding, infection, large complex wound, death, DVT, organ injury, blood vessel injury, major nerve injury, and the need for multiple procedures and/or reconstructive procedures.  The patient was placed upon the OR table. After general anesthesia, the lower abdomen was prepped and draped in sterile fashion. Timeout was done. She was on antibiotics. The right lower abdominal wall was opened. The  skin and subcutaneous tissue over the mons pubis and lower part of the right side of her pannus were debrided widely down to healthy bleeding tissue. Foust smell and the total area debrided was 22 cm x 8 cm x 4 cm. Hemostasis achieved. Wet-to-dry dressing placed. All final counts found to be correct. The patient was taken to the ICU in critical but stable condition.  2.  Tool used for debridement (curette, scapel, etc.)  Scalpel   3.  Frequency of surgical debridement.   First time   4.  Measurement of total devitalized tissue (wound surface) before and after surgical debridement.   Before 22 cm x 7 cm x 4 cm   After   0  5.  Area and depth of devitalized tissue removed from wound.  22 cm x 7 cm  X  4 cm    6.  Blood loss and description of tissue removed.  Minimal  Necrotic   7.  Evidence of the progress of the wound's response to treatment.  A.  Current wound volume (current dimensions and depth).  NA  B.  Presence (and extent of) of infection.  YES  EXTENSIVE   C.  Presence (and extent of) of non viable tissue.  YES 22 CM  X 7 CM  X 4 CM   D.  Other material in the wound that is expected to inhibit healing.  NO  8.  Was there any viable tissue removed (measurements): NO

## 2015-09-16 NOTE — Anesthesia Postprocedure Evaluation (Signed)
Anesthesia Post Note  Patient: Annette Walker  Procedure(s) Performed: Procedure(s) (LRB): IRRIGATION AND DEBRIDEMENT ABDOMEN (N/A)  Patient location during evaluation: SICU Anesthesia Type: General Level of consciousness: sedated Pain management: pain level controlled Vital Signs Assessment: post-procedure vital signs reviewed and stable Respiratory status: respiratory function stable, patient connected to nasal cannula oxygen, patient remains intubated per anesthesia plan and patient on ventilator - see flowsheet for VS Cardiovascular status: blood pressure returned to baseline and stable Postop Assessment: no signs of nausea or vomiting Anesthetic complications: no    Last Vitals:  Filed Vitals:   09/16/15 0600 09/16/15 0625  BP: 85/53 100/65  Pulse: 65 84  Temp:    Resp: 16 23    Last Pain:  Filed Vitals:   09/16/15 0630  PainSc: 0-No pain                 Deniqua Perry A

## 2015-09-16 NOTE — Consult Note (Signed)
PULMONARY / CRITICAL CARE MEDICINE   Name: Annette Walker MRN: [id number] DOB: December 31, 1950    ADMISSION DATE:  09/15/2015 CONSULTATION DATE:  09/15/15  REFERRING MD:  Cornett  CHIEF COMPLAINT:  Draining abdominal wound  SUBJECTIVE:  RT reports pt weaning on 5/5, awake.  No acute events.   VITAL SIGNS: BP 98/50 mmHg  Pulse 73  Temp(Src) 99.7 F (37.6 C) (Axillary)  Resp 21  Ht 5' 5.5" (1.664 m)  Wt 319 lb (144.697 kg)  BMI 52.26 kg/m2  SpO2 100%  LMP  (Approximate)  HEMODYNAMICS:    VENTILATOR SETTINGS: Vent Mode:  [-] PSV;CPAP FiO2 (%):  [40 %-100 %] 40 % Set Rate:  [16 bmp] 16 bmp Vt Set:  [480 mL] 480 mL PEEP:  [5 cmH20-8 cmH20] 5 cmH20 Pressure Support:  [5 cmH20-8 cmH20] 5 cmH20 Plateau Pressure:  [19 cmH20-23 cmH20] 19 cmH20  INTAKE / OUTPUT: I/O last 3 completed shifts: In: 4598.5 [I.V.:4548.5; IV Piggyback:50] Out: 1350 [Urine:1250; Blood:100]   PHYSICAL EXAMINATION: General: Adult AA female, obese, in NAD. Neuro: Awake, alert, follows commands, MAE HEENT: [place]/AT. PERRL, sclerae anicteric. Cardiovascular: RRR, no M/R/G.  Lungs: Respirations even and unlabored on vent, lungs bilaterally distant but clear - pulling >700 Vt Abdomen: RLL abdominal wound dressings C/D/I.  Abd soft, NT/ND.  Musculoskeletal: No gross deformities, no edema.  Skin: Intact, warm, no rashes.  LABS:  BMET  Recent Labs Lab 09/15/15 1929 09/16/15 0736  NA 136 142  K 2.9* 3.3*  CL 97* 110  CO2 18* 20*  BUN 51* 42*  CREATININE 4.50* 3.07*  GLUCOSE 136* 110*    Electrolytes  Recent Labs Lab 09/15/15 1929 09/16/15 0736  CALCIUM 8.6* 7.6*  MG  --  1.9  PHOS  --  4.2    CBC  Recent Labs Lab 09/15/15 1929 09/16/15 0736  WBC 24.3* 16.4*  HGB 12.1 10.3*  HCT 35.4* 29.8*  PLT 254 204    Coag's No results for input(s): APTT, INR in the last 168 hours.  Sepsis Markers  Recent Labs Lab 09/15/15 1946 09/16/15 0010 09/16/15 0736  LATICACIDVEN 6.09* 3.09*  2.4*  PROCALCITON  --   --  1.37    ABG  Recent Labs Lab 09/16/15 0348  PHART 7.292*  PCO2ART 40.5  PO2ART 131.0*    Liver Enzymes  Recent Labs Lab 09/15/15 1929 09/16/15 0736  AST 43* 37  ALT 23 21  ALKPHOS 73 57  BILITOT 1.7* 1.9*  ALBUMIN 2.5* 1.9*    Cardiac Enzymes No results for input(s): TROPONINI, PROBNP in the last 168 hours.  Glucose  Recent Labs Lab 09/16/15 0223 09/16/15 0407  GLUCAP 98 114*    Imaging Ct Abdomen Pelvis Wo Contrast  09/15/2015  CLINICAL DATA:  Acute abdominal pain. EXAM: CT ABDOMEN AND PELVIS WITHOUT CONTRAST TECHNIQUE: Multidetector CT imaging of the abdomen and pelvis was performed following the standard protocol without IV contrast. COMPARISON:  None. FINDINGS: The lung bases are clear. Diffuse fatty infiltration of the liver. The unenhanced appearance of the gallbladder, pancreas, spleen, adrenal glands, kidneys, abdominal aorta, inferior vena cava, and retroperitoneal lymph nodes is unremarkable. The stomach, small bowel, and colon are decompressed. Small periumbilical hernia containing fat. No free air or free fluid in the abdomen. Pelvis: The appendix is normal. Uterus and ovaries are not enlarged. Intrauterine device is present. 2 cm lesion in the left ovary contains macroscopic fat likely representing a dermoid cyst. No pelvic mass or lymphadenopathy. There is infiltration throughout the subcutaneous  fat of the low pelvis, extending over the right hip and groin region. There is prominent soft tissue gas. No discrete abscess. Appearance is consistent with infection with gas producing organism. Prominent lymph nodes in the right groin region are likely reactive. Degenerative changes in the spine. No destructive bone lesions. IMPRESSION: Infiltration and gas in the soft tissues of the low right pelvis extending over the right hip and groin region. Changes are consistent with necrotizing fasciitis due to gas-forming organism. No discrete  abscess. Diffuse fatty infiltration of the liver. Small dermoid cyst in the left ovary. These results were called by telephone at the time of interpretation on 09/15/2015 at 10:18 pm to Dr. Erskine EmeryHRIS DAVIS , who verbally acknowledged these results. Electronically Signed   By: Burman NievesWilliam  Stevens M.D.   On: 09/15/2015 22:20   Dg Chest Port 1 View  09/16/2015  CLINICAL DATA:  Respiratory failure EXAM: PORTABLE CHEST 1 VIEW COMPARISON:  02/02/2010 FINDINGS: Endotracheal tube is 2 cm above the carina. Patchy opacity in the central lungs and upper lobes may represent pneumonia. Pulmonary hemorrhage could also have this appearance. No large effusions. No pneumothorax. IMPRESSION: Satisfactory ET tube position. Patchy central and upper lobe opacities bilaterally, possibly pneumonia. Electronically Signed   By: Ellery Plunkaniel R Mitchell M.D.   On: 09/16/2015 02:37     STUDIES:  CT A / P 05/02 > gas in soft tissues of the low right pelvis extending over the right hip and groin region, changes c/w necrotizing fasciitis due to gas forming organism.  CULTURES: Blood 5/03 >> Wound 5/03 >>  ANTIBIOTICS: Vanc 05/03 >> Zosyn 05/03 >> Clindamycin 05/03 >>  SIGNIFICANT EVENTS: 05/03 > admitted with nec fasc of right lower pelvis > taken to OR for emergent I&D > returned to ICU on vent.  LINES/TUBES: ETT 05/03 >   DISCUSSION: 65 y.o. F admitted 05/03 with nec fasc of right lower pelvis requiring emergent I&D.  Returned to the ICU on the vent post operatively.  Extubated 5/3 am  ASSESSMENT / PLAN:  INFECTIOUS A:   Necrotizing fasciitis of right lower pelvis - s/p emergent I&D 09/16/15 (Dr. Luisa Hartornett) P:   Post op care per CCS. Abx as above (vanc / zosyn / clindamycin).  Follow cultures as above. PCT algorithm to limit abx exposure.  PULMONARY A: Post operative VDRF. P:   Now extubation Pulmonary hygiene post extubation - IS, early mobilization   CARDIOVASCULAR A:  Hx HTN. P:  Monitor  hemodynamics. Continue outpatient ASA. Trend lactate. Hold outpatient toprol-xl, benicar.  RENAL A:   Hypokalemia. AKI. AGMA - lactate clearing P:   NS @ 75, reduce to KVO once taking PO's BMP in AM.  GASTROINTESTINAL A:   GI prophylaxis. Nutrition. P:   SUP: Pantoprazole, consider d/c in am 5/4 NPO.  HEMATOLOGIC A:   VTE Prophylaxis. P:  SCD's / heparin. Trend CBC  ENDOCRINE A:   No acute issues.  P:   Monitor glucose on BMP.  NEUROLOGIC A:   Acute metabolic encephalopathy. P:   Discontinue sedation  RASS goal: n/a PRN fentanyl for pain    Family updated: None.  Interdisciplinary Family Meeting v Palliative Care Meeting:  Due by: 09/22/15.   Will follow up this PM.  If patient remains stable, will transition out of ICU to SDU.   Canary BrimBrandi Barnes Florek, NP-C [facility] Pgr: [phone] or if no answer [phone] 09/16/2015, 11:04 AM

## 2015-09-16 NOTE — Progress Notes (Signed)
Initial Nutrition Assessment  DOCUMENTATION CODES:   Morbid obesity  INTERVENTION:  -If aggressive nutrition support within GOC, recommend tube feeding.  -Tube feeding recommendations: -Vital High Protein formula at 45 ml/hr with Prostat 60 ml BID.  Total TF regimen provides 1480 kcals, 155 g protein, 903 ml of free water daily  NUTRITION DIAGNOSIS:   Inadequate oral intake related to inability to eat as evidenced by NPO status  GOAL:   Provide needs based on ASPEN/SCCM guidelines   MONITOR:   Vent status, Labs, Weight trends, Skin, I & O's  REASON FOR ASSESSMENT:   Ventilator   ASSESSMENT:   65 year old female who presents the emergency department tonight after being sent here from urgent care with concern for soft tissue infection. She reports that 2 days ago she noticed what she thought was a "boil" on her right inguinal area. She says that it continued to grow, and then today began draining purulent fluid. She has been feeling a little bit fatigued and tired today, but until today had been feeling in her usual state of health. She denies any fevers or chills, but does feel fatigued, and lightheaded with standing. She was evaluated at urgent care, and sent here due to concern for serious soft tissue infection. She complains of a painful, draining, wound on her right inguinal crease. She says it's been draining purulent, foul-smelling fluid all day. She has no history of serious soft tissue or skin infections. She has no history of abscesses. She is not diabetic.  5/3 surgery for debridement of abdominal wound. Pt intubated.  Tube feeding recommendations provided. Pt is awake and alert.   Temp (24hrs), Avg:98.7 F (37.1 C), Min:97.1 F (36.2 C), Max:99.8 F (37.7 C)  NFPE: no muscle or fat depletion, unable to assess edema.   Labs reviewed; K 3.3, BUN 42, creatinine 3.07, Ca 7.6, GFR 15, CBGs 98-114.  Meds reviewed.  Diet Order:  Diet NPO time specified  Skin:   Wound (see comment) (abdominal/inguinal wound)  Last BM:  unknown  Height:   Ht Readings from Last 1 Encounters:  09/16/15 5' 5.5" (1.664 m)    Weight:   Wt Readings from Last 1 Encounters:  09/15/15 319 lb (144.697 kg)    Ideal Body Weight:  56.8 kg  BMI:  Body mass index is 52.26 kg/(m^2).  Estimated Nutritional Needs:   Kcal:  1249-1420  Protein: >/=   142  Fluid:  1.5 L  EDUCATION NEEDS:   No education needs identified at this time  Beryle QuantMeredith Andrews, MS [facility] Dietetic Intern Pager [phone]  Maureen ChattersKatie Reymundo Winship, RD, LDN Pager #: [phone] After-Hours Pager #: [phone]

## 2015-09-16 NOTE — Consult Note (Signed)
PULMONARY / CRITICAL CARE MEDICINE   Name: Annette Walker MRN: [id number] DOB: 1950/11/08    ADMISSION DATE:  09/15/2015 CONSULTATION DATE:  09/15/15  REFERRING MD:  Cornett  CHIEF COMPLAINT:  Draining abdominal wound  HISTORY OF PRESENT ILLNESS:  Pt is encephelopathic; therefore, this HPI is obtained from chart review. Annette Walker is a 65 y.o. female with PMH of HTN.  She presented to urgent care 05/02 with abdominal / groin wound that had been draining malodorous discharge x 3 days associated with abdominal pain.  She had not been able to eat for 3 days.  She initially thought that the wound was a boil and since it continued to grow and had increase in drainage, she felt she needed to seek medical attention.  She was seen at urgent care and sent to ED for further evaluation.  In ED, she had CT which revealed gas in soft tissues of the low right pelvis extending over the right hip and groin region, changes c/w necrotizing fasciitis due to gas forming organism.  She was seen by general surgery who took her to the OR emergently.  Post operatively, she returned to the ICU on the vent and PCCM was asked to assist with vent management.  PAST MEDICAL HISTORY :  She  has a past medical history of Hypertension.  PAST SURGICAL HISTORY: She  has no past surgical history on file.  No Known Allergies  No current facility-administered medications on file prior to encounter.   Current Outpatient Prescriptions on File Prior to Encounter  Medication Sig  . aspirin 81 MG tablet Take 81 mg by mouth daily.  . metoprolol succinate (TOPROL-XL) 50 MG 24 hr tablet Take 50 mg by mouth daily. Take with or immediately following a meal.    FAMILY HISTORY:  Her has no family status information on file.   SOCIAL HISTORY: She  reports that she has never smoked. She does not have any smokeless tobacco history on file. She reports that she does not drink alcohol or use illicit drugs.  REVIEW OF SYSTEMS:    Unable to obtain as pt is encephalopathic.  SUBJECTIVE:  On vent, sedated.  VITAL SIGNS: BP 93/48 mmHg  Pulse 108  Temp(Src) 98.4 F (36.9 C) (Oral)  Resp 14  Ht 5\' 6"  (1.676 m)  Wt 319 lb (144.697 kg)  BMI 51.51 kg/m2  SpO2 99%  LMP  (Approximate)  HEMODYNAMICS:    VENTILATOR SETTINGS:    INTAKE / OUTPUT:     PHYSICAL EXAMINATION: General: Adult AA female, obese, in NAD. Neuro: Sedated, comfortable, does not follow commands. HEENT: [place]/AT. PERRL, sclerae anicteric. Cardiovascular: RRR, no M/R/G.  Lungs: Respirations even and unlabored.  Coarse bilaterally. Abdomen: RLL abdominal wound dressings C/D/I.  Abd soft, NT/ND.  Musculoskeletal: No gross deformities, no edema.  Skin: Intact, warm, no rashes.  LABS:  BMET  Recent Labs Lab 09/15/15 1929  NA 136  K 2.9*  CL 97*  CO2 18*  BUN 51*  CREATININE 4.50*  GLUCOSE 136*    Electrolytes  Recent Labs Lab 09/15/15 1929  CALCIUM 8.6*    CBC  Recent Labs Lab 09/15/15 1929  WBC 24.3*  HGB 12.1  HCT 35.4*  PLT 254    Coag's No results for input(s): APTT, INR in the last 168 hours.  Sepsis Markers  Recent Labs Lab 09/15/15 1946  LATICACIDVEN 6.09*    ABG No results for input(s): PHART, PCO2ART, PO2ART in the last 168 hours.  Liver Enzymes  Recent Labs Lab 09/15/15 1929  AST 43*  ALT 23  ALKPHOS 73  BILITOT 1.7*  ALBUMIN 2.5*    Cardiac Enzymes No results for input(s): TROPONINI, PROBNP in the last 168 hours.  Glucose No results for input(s): GLUCAP in the last 168 hours.  Imaging Ct Abdomen Pelvis Wo Contrast  09/15/2015  CLINICAL DATA:  Acute abdominal pain. EXAM: CT ABDOMEN AND PELVIS WITHOUT CONTRAST TECHNIQUE: Multidetector CT imaging of the abdomen and pelvis was performed following the standard protocol without IV contrast. COMPARISON:  None. FINDINGS: The lung bases are clear. Diffuse fatty infiltration of the liver. The unenhanced appearance of the gallbladder,  pancreas, spleen, adrenal glands, kidneys, abdominal aorta, inferior vena cava, and retroperitoneal lymph nodes is unremarkable. The stomach, small bowel, and colon are decompressed. Small periumbilical hernia containing fat. No free air or free fluid in the abdomen. Pelvis: The appendix is normal. Uterus and ovaries are not enlarged. Intrauterine device is present. 2 cm lesion in the left ovary contains macroscopic fat likely representing a dermoid cyst. No pelvic mass or lymphadenopathy. There is infiltration throughout the subcutaneous fat of the low pelvis, extending over the right hip and groin region. There is prominent soft tissue gas. No discrete abscess. Appearance is consistent with infection with gas producing organism. Prominent lymph nodes in the right groin region are likely reactive. Degenerative changes in the spine. No destructive bone lesions. IMPRESSION: Infiltration and gas in the soft tissues of the low right pelvis extending over the right hip and groin region. Changes are consistent with necrotizing fasciitis due to gas-forming organism. No discrete abscess. Diffuse fatty infiltration of the liver. Small dermoid cyst in the left ovary. These results were called by telephone at the time of interpretation on 09/15/2015 at 10:18 pm to Dr. Erskine Emery , who verbally acknowledged these results. Electronically Signed   By: Burman Nieves M.D.   On: 09/15/2015 22:20     STUDIES:  CT A / P 05/02 > gas in soft tissues of the low right pelvis extending over the right hip and groin region, changes c/w necrotizing fasciitis due to gas forming organism.  CULTURES: Blood 05/03 > Wound 05/03 >  ANTIBIOTICS: Vanc 05/03 > Zosyn 05/03 > Clindamycin 05/03 >  SIGNIFICANT EVENTS: 05/03 > admitted with nec fasc of right lower pelvis > taken to OR for emergent I&D > returned to ICU on vent.  LINES/TUBES: ETT 05/03 >   DISCUSSION: 65 y.o. F admitted 05/03 with nec fasc of right lower pelvis  requiring emergent I&D.  Returned to the ICU on the vent post operatively.  ASSESSMENT / PLAN:  INFECTIOUS A:   Necrotizing fasciitis of right lower pelvis - s/p emergent I&D 09/16/15 (Dr. Luisa Hart) P:   Post op care per CCS. Abx as above (vanc / zosyn / clindamycin).  Follow cultures as above. PCT algorithm to limit abx exposure.  PULMONARY A: Post operative VDRF. P:   Full vent support. Assess ABG. Wean as able. SBT in AM with goal extubation. CXR in AM.  CARDIOVASCULAR A:  Hx HTN. P:  Monitor hemodynamics. Continue outpatient ASA. Trend lactate. Hold outpatient toprol-xl, benicar.  RENAL A:   Hypokalemia. AKI. AGMA - lactate. P:   NS @ 125. BMP in AM.  GASTROINTESTINAL A:   GI prophylaxis. Nutrition. P:   SUP: Pantoprazole. NPO.  HEMATOLOGIC A:   VTE Prophylaxis. P:  SCD's / heparin. CBC in AM.  ENDOCRINE A:   No acute issues.  P:   Monitor  glucose on BMP.  NEUROLOGIC A:   Acute metabolic encephalopathy. P:   Sedation:  Propofol gtt / Fentanyl PRN. RASS goal: 0 to -1. Daily WUA.   Family updated: None.  Interdisciplinary Family Meeting v Palliative Care Meeting:  Due by: 09/22/15.  CC time: 35 minutes.   Rutherford Guysahul Marqueta Pulley, [place]PA - C [facility] Pager: [phone]  or [phone] 09/16/2015, 12:06 AM

## 2015-09-16 NOTE — Transfer of Care (Signed)
Immediate Anesthesia Transfer of Care Note  Patient: Annette Walker  Procedure(s) Performed: Procedure(s): IRRIGATION AND DEBRIDEMENT ABDOMEN (N/A)  Patient Location: SICU  Anesthesia Type:General  Level of Consciousness: Patient remains intubated per anesthesia plan  Airway & Oxygen Therapy: Patient remains intubated per anesthesia plan and Patient placed on Ventilator (see vital sign flow sheet for setting)  Post-op Assessment: Report given to RN and Post -op Vital signs reviewed and stable  Post vital signs: Reviewed and stable  Last Vitals:  Filed Vitals:   09/15/15 1916 09/15/15 2215  BP: 129/98 93/48  Pulse: 109 108  Temp: 36.9 C   Resp: 14     Last Pain:  Filed Vitals:   09/15/15 2253  PainSc: 8          Complications: No apparent anesthesia complications

## 2015-09-16 NOTE — Procedures (Signed)
Extubation Procedure Note  Patient Details:   Name: Annette Walker DOB: 19-May-1950 MRN: [id number]   Airway Documentation:     Evaluation  O2 sats: stable throughout Complications: No apparent complications Patient did tolerate procedure well. Bilateral Breath Sounds: Clear, Diminished   Yes   Patient extubated to a 4L [place]. Cuff leak was heard. No stridor was noted. RN was at bedside with RT. Pt was able to speak and cough. RT will continue to monitor.  Darolyn Ruashley M Myna Freimark 09/16/2015, 10:57 AM

## 2015-09-17 ENCOUNTER — Inpatient Hospital Stay (HOSPITAL_COMMUNITY): Payer: 59

## 2015-09-17 ENCOUNTER — Encounter (HOSPITAL_COMMUNITY): Payer: Self-pay | Admitting: Surgery

## 2015-09-17 DIAGNOSIS — R100 Acute abdomen: Secondary | ICD-10-CM

## 2015-09-17 DIAGNOSIS — M726 Necrotizing fasciitis: Secondary | ICD-10-CM

## 2015-09-17 DIAGNOSIS — N179 Acute kidney failure, unspecified: Secondary | ICD-10-CM

## 2015-09-17 DIAGNOSIS — S31109D Unspecified open wound of abdominal wall, unspecified quadrant without penetration into peritoneal cavity, subsequent encounter: Secondary | ICD-10-CM

## 2015-09-17 LAB — BASIC METABOLIC PANEL
Anion gap: 11 (ref 5–15)
BUN: 28 mg/dL — AB (ref 6–20)
CHLORIDE: 108 mmol/L (ref 101–111)
CO2: 23 mmol/L (ref 22–32)
CREATININE: 2.03 mg/dL — AB (ref 0.44–1.00)
Calcium: 7.7 mg/dL — ABNORMAL LOW (ref 8.9–10.3)
GFR calc Af Amer: 29 mL/min — ABNORMAL LOW (ref >60)
GFR calc non Af Amer: 25 mL/min — ABNORMAL LOW (ref >60)
Glucose, Bld: 102 mg/dL — ABNORMAL HIGH (ref 65–99)
Potassium: 3.1 mmol/L — ABNORMAL LOW (ref 3.5–5.1)
SODIUM: 142 mmol/L (ref 135–145)

## 2015-09-17 LAB — CBC
HCT: 27.1 % — ABNORMAL LOW (ref 36.0–46.0)
Hemoglobin: 9.3 g/dL — ABNORMAL LOW (ref 12.0–15.0)
MCH: 26.6 pg (ref 26.0–34.0)
MCHC: 34.3 g/dL (ref 30.0–36.0)
MCV: 77.4 fL — AB (ref 78.0–100.0)
Platelets: 202 10*3/uL (ref 150–400)
RBC: 3.5 MIL/uL — ABNORMAL LOW (ref 3.87–5.11)
RDW: 14.2 % (ref 11.5–15.5)
WBC: 13.4 10*3/uL — ABNORMAL HIGH (ref 4.0–10.5)

## 2015-09-17 LAB — URINE CULTURE: CULTURE: NO GROWTH

## 2015-09-17 LAB — VANCOMYCIN, RANDOM: VANCOMYCIN RM: 9 ug/mL

## 2015-09-17 LAB — PROCALCITONIN: Procalcitonin: 1 ng/mL

## 2015-09-17 MED ORDER — VANCOMYCIN HCL 10 G IV SOLR
1750.0000 mg | INTRAVENOUS | Status: DC
  Administered 2015-09-17: 1750 mg via INTRAVENOUS
  Filled 2015-09-17 (×2): qty 1750

## 2015-09-17 MED ORDER — METOPROLOL SUCCINATE ER 25 MG PO TB24
50.0000 mg | ORAL_TABLET | Freq: Every day | ORAL | Status: DC
  Administered 2015-09-17 – 2015-09-20 (×4): 50 mg via ORAL
  Filled 2015-09-17 (×4): qty 2

## 2015-09-17 MED ORDER — ASPIRIN 81 MG PO CHEW
81.0000 mg | CHEWABLE_TABLET | Freq: Every day | ORAL | Status: DC
  Administered 2015-09-17 – 2015-09-21 (×5): 81 mg via ORAL
  Filled 2015-09-17 (×5): qty 1

## 2015-09-17 MED ORDER — POTASSIUM CHLORIDE CRYS ER 20 MEQ PO TBCR
40.0000 meq | EXTENDED_RELEASE_TABLET | Freq: Two times a day (BID) | ORAL | Status: AC
  Administered 2015-09-17 (×2): 40 meq via ORAL
  Filled 2015-09-17 (×2): qty 2

## 2015-09-17 MED ORDER — PNEUMOCOCCAL VAC POLYVALENT 25 MCG/0.5ML IJ INJ
0.5000 mL | INJECTION | INTRAMUSCULAR | Status: AC
  Administered 2015-09-18: 0.5 mL via INTRAMUSCULAR
  Filled 2015-09-17: qty 0.5

## 2015-09-17 NOTE — Care Management Note (Signed)
Case Management Note  Patient Details  Name: Annette Walker MRN: [id number] Date of Birth: 02-02-1951  Subjective/Objective:    Pt lives with spouse and 63 year-old dad, reports spouse is retired and will be available to assist as needed.  Will need BID dsg changes.  Provided list of home health agencies and referral made to [facility] per choice.                           Expected Discharge Plan:  Home w Home Health Services  Discharge planning Services  CM Consult  Post Acute Care Choice:  Home Health Choice offered to:  Patient  [facility]  Status of Service:  Completed, signed off  Magdalene RiverMayo, Olimpia Tinch T, CaliforniaRN 09/17/2015, 2:18 PM

## 2015-09-17 NOTE — Progress Notes (Signed)
Report called to 2c RN.

## 2015-09-17 NOTE — Evaluation (Addendum)
Physical Therapy Evaluation Patient Details Name: Annette Walker MRN: [id number] DOB: 04/19/1951 Today's Date: 09/17/2015   History of Present Illness  Pt adm with necrotizing fasciitis of lower abdomen and sepsis. Underwent I&D on 09/15/15. PMH - HTN, morbid obesity  Clinical Impression  Pt admitted with above diagnosis and presents to PT with functional limitations due to deficits listed below (See PT problem list). Pt needs skilled PT to maximize independence and safety to allow discharge to home. Pt with a decline in her independence from baseline and is very motivated to return to baseline. Will follow acutely but don't feel she will need further PT after DC.     Follow Up Recommendations No PT follow up    Equipment Recommendations  None recommended by PT    Recommendations for Other Services       Precautions / Restrictions Precautions Precautions: None Restrictions Weight Bearing Restrictions: No      Mobility  Bed Mobility               General bed mobility comments: Pt up in chair.  Transfers Overall transfer level: Needs assistance Equipment used: None Transfers: Sit to/from Stand Sit to Stand: Min guard         General transfer comment: Incr time and effort due to pain  Ambulation/Gait Ambulation/Gait assistance: Min guard Ambulation Distance (Feet): 110 Feet Assistive device: None Gait Pattern/deviations: Step-through pattern;Decreased stride length;Wide base of support Gait velocity: decr Gait velocity interpretation: Below normal speed for age/gender General Gait Details: Intially slightly unsteady gait but no loss of balance. Stability improved with distance.  Stairs            Wheelchair Mobility    Modified Rankin (Stroke Patients Only)       Balance Overall balance assessment: Needs assistance Sitting-balance support: No upper extremity supported;Feet supported Sitting balance-Leahy Scale: Normal     Standing balance  support: No upper extremity supported;During functional activity Standing balance-Leahy Scale: Good                               Pertinent Vitals/Pain Pain Assessment: Faces Faces Pain Scale: Hurts even more (with transitional movements) Pain Location: lower abdomen Pain Descriptors / Indicators: Grimacing;Guarding Pain Intervention(s): Limited activity within patient's tolerance;Monitored during session    Home Living Family/patient expects to be discharged to:: Private residence Living Arrangements: Spouse/significant other;Parent Available Help at Discharge: Family;Available 24 hours/day Type of Home: House Home Access: Stairs to enter Entrance Stairs-Rails: Right Entrance Stairs-Number of Steps: 3 Home Layout: One level Home Equipment: None      Prior Function Level of Independence: Independent         Comments: Works at [employer]      Hand Dominance        Extremity/Trunk Assessment   Upper Extremity Assessment: Generalized weakness           Lower Extremity Assessment: Generalized weakness         Communication   Communication: No difficulties  Cognition Arousal/Alertness: Awake/alert Behavior During Therapy: WFL for tasks assessed/performed Overall Cognitive Status: Within Functional Limits for tasks assessed                      General Comments      Exercises        Assessment/Plan    PT Assessment Patient needs continued PT services  PT Diagnosis Generalized weakness   PT Problem  List Decreased strength;Decreased activity tolerance;Decreased mobility;Obesity  PT Treatment Interventions Gait training;Functional mobility training;Therapeutic activities;Therapeutic exercise;Patient/family education   PT Goals (Current goals can be found in the Care Plan section) Acute Rehab PT Goals Patient Stated Goal: Return home PT Goal Formulation: With patient Time For Goal Achievement: 09/24/15 Potential to Achieve  Goals: Good    Frequency Min 3X/week   Barriers to discharge        Co-evaluation               End of Session   Activity Tolerance: Patient tolerated treatment well Patient left: in chair;with call bell/phone within reach Nurse Communication: Mobility status         Time: 1610-9604 PT Time Calculation (min) (ACUTE ONLY): 16 min   Charges:   PT Evaluation $PT Eval Moderate Complexity: 1 Procedure     PT G Codes:        Annette Walker 09-25-15, 3:05 PM [facility] PT [phone]

## 2015-09-17 NOTE — Progress Notes (Signed)
Transferred to 2C bed 3 with wheelchair and monitor. RN to receive in room. SCDS with patient.

## 2015-09-17 NOTE — Progress Notes (Signed)
[place] TEAM 1 - Stepdown/ICU TEAM PROGRESS NOTE  Annette Walker [id number]:[id number] DOB: [birth year] DOA: 09/15/2015 PCP: No PCP Per Patient  Admit HPI / Brief Narrative: 65 y.o. F admitted 05/03 with nec fasc of right lower pelvis requiring emergent I&D. Returned to the ICU on the vent post operatively. Extubated 5/3 am  Significant Events: 05/03 > admitted with [facility] of right lower pelvis > taken to OR for emergent I&D > returned to ICU on vent 5/4 transfer to [facility] service   HPI/Subjective: Pt is resting comfortably in a bedside chair.  She denies cp, sob, n/v, or leg cramps/swelling.  She reports modest pain at her surgical site.    Assessment/Plan:  Necrotizing fasciitis of right lower pelvis s/p emergent I&D 09/16/15- wound care per Gen Surgery   Post operative VDRF Rapidly resolved - resp status currently stable   Sinus Tachycardia May still be some element of volume depletion - at risk for DVT/PE but currently does not have suggestive sx - check TSH - likely component of BB rebound as well so will resume Toprol XL  HTN BP reasonably well controlled - follow trend w/ resumption of Toprol  Hypokalemia Replace and follow - check Mg  AKI crt 4.5 at admit - steadily improving w/ volume - follow   Acute metabolic encephalopathy Likely toxic met enceph due to fasciitis - resolved   Morbid obesity - Body mass index is 53.15 kg/(m^2).  Code Status: FULL Family Communication: no family present at time of exam Disposition Plan: transfer to SDU   Consultants: PCCM Gen Surgery   Antibiotics: Clindamycin 5/2 > Zosyn 5/2 > Vanc 5/2 >  DVT prophylaxis: SQ heparin   Objective: Blood pressure 148/97, pulse 78, temperature 98.3 F (36.8 C), temperature source Oral, resp. rate 25, height 5' 6"  (1.676 m), weight 149.3 kg (329 lb 2.4 oz), SpO2 93 %.  Intake/Output Summary (Last 24 hours) at 09/17/15 0852 Last data filed at 09/17/15 0800  Gross per 24 hour  Intake    3829 ml  Output   3150 ml  Net    679 ml    Exam: General: No acute respiratory distress Lungs: Clear to auscultation bilaterally without wheezes or crackles Cardiovascular: tachycardic - regular - no M or gallup  Abdomen: mild tenderness at site of incision, morbidly obese, soft, bowel sounds positive, no rebound Extremities: No significant cyanosis, or clubbing; trace edema bilateral lower extremities  Data Reviewed: Basic Metabolic Panel:  Recent Labs Lab 09/15/15 1929 09/16/15 0736 09/17/15 0228  NA 136 142 142  K 2.9* 3.3* 3.1*  CL 97* 110 108  CO2 18* 20* 23  GLUCOSE 136* 110* 102*  BUN 51* 42* 28*  CREATININE 4.50* 3.07* 2.03*  CALCIUM 8.6* 7.6* 7.7*  MG  --  1.9  --   PHOS  --  4.2  --     CBC:  Recent Labs Lab 09/15/15 1929 09/16/15 0736 09/17/15 0228  WBC 24.3* 16.4* 13.4*  NEUTROABS 21.0*  --   --   HGB 12.1 10.3* 9.3*  HCT 35.4* 29.8* 27.1*  MCV 80.3 78.2 77.4*  PLT 254 204 202    Liver Function Tests:  Recent Labs Lab 09/15/15 1929 09/16/15 0736  AST 43* 37  ALT 23 21  ALKPHOS 73 57  BILITOT 1.7* 1.9*  PROT 8.0 6.5  ALBUMIN 2.5* 1.9*   CBG:  Recent Labs Lab 09/16/15 0223 09/16/15 0407  GLUCAP 98 114*    Recent Results (from the past 240  hour(s))  Culture, blood (Routine x 2)     Status: None (Preliminary result)   Collection Time: 09/15/15  7:27 PM  Result Value Ref Range Status   Specimen Description BLOOD RIGHT ARM  Final   Special Requests   Final    BOTTLES DRAWN AEROBIC AND ANAEROBIC BLUE 10CC RED 5CC   Culture NO GROWTH < 24 HOURS  Final   Report Status PENDING  Incomplete  Culture, blood (Routine x 2)     Status: None (Preliminary result)   Collection Time: 09/15/15  7:35 PM  Result Value Ref Range Status   Specimen Description BLOOD LEFT ARM  Final   Special Requests IN PEDIATRIC BOTTLE 4CC  Final   Culture NO GROWTH < 24 HOURS  Final   Report Status PENDING  Incomplete  MRSA PCR Screening     Status: None    Collection Time: 09/16/15  2:00 AM  Result Value Ref Range Status   MRSA by PCR NEGATIVE NEGATIVE Final    Comment:        The GeneXpert MRSA Assay (FDA approved for NASAL specimens only), is one component of a comprehensive MRSA colonization surveillance program. It is not intended to diagnose MRSA infection nor to guide or monitor treatment for MRSA infections.      Studies:   Recent x-ray studies have been reviewed in detail by the Attending Physician  Scheduled Meds:  Scheduled Meds: . aspirin  81 mg Per Tube Daily  . clindamycin (CLEOCIN) IV  900 mg Intravenous Q8H  . heparin subcutaneous  5,000 Units Subcutaneous Q8H  . pantoprazole (PROTONIX) IV  40 mg Intravenous Q24H  . piperacillin-tazobactam (ZOSYN)  IV  3.375 g Intravenous Q8H  . potassium chloride  40 mEq Oral BID    Time spent on care of this patient: 35 mins   Areesha Dehaven T , MD   [facility] Office  [phone] Pager - Text Page per Shea Evans as per below:  On-Call/Text Page:      [email or web address]      password [email or web address]  If 7PM-7AM, please contact night-coverage [email or web address] Password [email or web address] 09/17/2015, 8:52 AM   LOS: 1 day

## 2015-09-17 NOTE — Progress Notes (Signed)
2 Days Post-Op  Subjective: She is up in the chair and looks pretty good.  I looked at her wound, it was changed just prior to [facility] to [facility]. She is up in the chair so not the best exam, all of the wound is under her panus, but all that I can see from this position is clean.  She says there was much less drainage than day prior.    Objective: Vital signs in last 24 hours: Temp:  [98.1 F (36.7 C)-99.3 F (37.4 C)] 98.3 F (36.8 C) (05/04 0829) Pulse Rate:  [71-131] 95 (05/04 1000) Resp:  [10-41] 28 (05/04 0900) BP: (73-168)/(46-97) 114/70 mmHg (05/04 1005) SpO2:  [89 %-100 %] 99 % (05/04 1005) Weight:  [149.3 kg (329 lb 2.4 oz)] 149.3 kg (329 lb 2.4 oz) (05/04 0600) Last BM Date:  (PTA) PO 480 Urine 3150 Afebrile, VSS  RR 30's Labs show creatinine improved down to 2/03 K+ 3.1 WBC down to 13.4/24.3 on admit  Intake/Output from previous day: 05/03 0701 - 05/04 0700 In: 3774 [P.O.:480; I.V.:2894; IV Piggyback:400] Out: 3150 [Urine:3150] Intake/Output this shift: Total I/O In: 360 [P.O.:360] Out: 650 [Urine:650]  General appearance: alert, cooperative and no distress Skin: wound is under the panus, but looks good with wet to dry dressing.  Lab Results:   Recent Labs  09/16/15 0736 09/17/15 0228  WBC 16.4* 13.4*  HGB 10.3* 9.3*  HCT 29.8* 27.1*  PLT 204 202    BMET  Recent Labs  09/16/15 0736 09/17/15 0228  NA 142 142  K 3.3* 3.1*  CL 110 108  CO2 20* 23  GLUCOSE 110* 102*  BUN 42* 28*  CREATININE 3.07* 2.03*  CALCIUM 7.6* 7.7*   PT/INR No results for input(s): LABPROT, INR in the last 72 hours.   Recent Labs Lab 09/15/15 1929 09/16/15 0736  AST 43* 37  ALT 23 21  ALKPHOS 73 57  BILITOT 1.7* 1.9*  PROT 8.0 6.5  ALBUMIN 2.5* 1.9*     Lipase     Component Value Date/Time   LIPASE 25 06/26/2007 1000     Studies/Results: Ct Abdomen Pelvis Wo Contrast  09/15/2015  CLINICAL DATA:  Acute abdominal pain. EXAM: CT ABDOMEN AND PELVIS WITHOUT  CONTRAST TECHNIQUE: Multidetector CT imaging of the abdomen and pelvis was performed following the standard protocol without IV contrast. COMPARISON:  None. FINDINGS: The lung bases are clear. Diffuse fatty infiltration of the liver. The unenhanced appearance of the gallbladder, pancreas, spleen, adrenal glands, kidneys, abdominal aorta, inferior vena cava, and retroperitoneal lymph nodes is unremarkable. The stomach, small bowel, and colon are decompressed. Small periumbilical hernia containing fat. No free air or free fluid in the abdomen. Pelvis: The appendix is normal. Uterus and ovaries are not enlarged. Intrauterine device is present. 2 cm lesion in the left ovary contains macroscopic fat likely representing a dermoid cyst. No pelvic mass or lymphadenopathy. There is infiltration throughout the subcutaneous fat of the low pelvis, extending over the right hip and groin region. There is prominent soft tissue gas. No discrete abscess. Appearance is consistent with infection with gas producing organism. Prominent lymph nodes in the right groin region are likely reactive. Degenerative changes in the spine. No destructive bone lesions. IMPRESSION: Infiltration and gas in the soft tissues of the low right pelvis extending over the right hip and groin region. Changes are consistent with necrotizing fasciitis due to gas-forming organism. No discrete abscess. Diffuse fatty infiltration of the liver. Small dermoid cyst in the left  ovary. These results were called by telephone at the time of interpretation on 09/15/2015 at 10:18 pm to Dr. Erskine Emery , who verbally acknowledged these results. Electronically Signed   By: Burman Nieves M.D.   On: 09/15/2015 22:20   Dg Chest Port 1 View  09/16/2015  CLINICAL DATA:  Respiratory failure EXAM: PORTABLE CHEST 1 VIEW COMPARISON:  02/02/2010 FINDINGS: Endotracheal tube is 2 cm above the carina. Patchy opacity in the central lungs and upper lobes may represent pneumonia.  Pulmonary hemorrhage could also have this appearance. No large effusions. No pneumothorax. IMPRESSION: Satisfactory ET tube position. Patchy central and upper lobe opacities bilaterally, possibly pneumonia. Electronically Signed   By: Ellery Plunk M.D.   On: 09/16/2015 02:37    Medications: . aspirin  81 mg Oral Daily  . clindamycin (CLEOCIN) IV  900 mg Intravenous Q8H  . heparin subcutaneous  5,000 Units Subcutaneous Q8H  . metoprolol succinate  50 mg Oral Daily  . piperacillin-tazobactam (ZOSYN)  IV  3.375 g Intravenous Q8H  . [START ON 09/18/2015] pneumococcal 23 valent vaccine  0.5 mL Intramuscular Tomorrow-1000  . potassium chloride  40 mEq Oral BID   . sodium chloride 125 mL/hr (09/17/15 0540)    Assessment/Plan Necrotizing fasciitis of lower abdominal wall and pannus Debridement of skin and subcutaneous tissue lower abdominal wall measuring 22 cm x 8 cm x 4 cm, 09/16/15, Dr. Maisie Fus Cornett/  Dressing changes BID 09/16/15 MT Sepsis VDRF/extubated 09/16/15 Acute Kidney injury (creatinine 4.5 on admit) Acute metabolic encephalopathy  Hypokalemia/Medicine following Hx of hypertension BMI 53.15  ID: gm Positive Rods on gram stain/DAY 3 clindamycin/Zosyn FEN:  Regular diet/IV fluids at 125 VTE:  Heparin/SCD  Plan:  Continue current antibiotics and dressing changes.  She will need home health to help with dressing changes at home.    LOS: 1 day    Montserrath Madding 09/17/2015 [phone]

## 2015-09-17 NOTE — Progress Notes (Signed)
Pharmacy Antibiotic Note  Annette Walker is a 65 y.o. obese female admitted on 09/15/2015 with sepsis- abdominal wall abscess, nec fasciitis.  Pharmacy has been consulted for vancomycin and Zosyn dosing.  SCr rapidly improving from 4.5 on 5/2  to 2.03 today after resuscitation.  UOP 0.9 cc/kg/hr.  Vancomycin level ordered by 1st shift today is 9 after loading dose on 5/2 - 48hrs since last dose.   Plan: Re-dose vancomycin 1750 mg IV every 24 hours.  Monitor renal function and further adjust dosing as appropriate.  Monitor culture results and ability to de-escalate antibiotics.   Height: 5\' 6"  (167.6 cm) Weight: (!) 329 lb 2.4 oz (149.3 kg) IBW/kg (Calculated) : 59.3  Temp (24hrs), Avg:98.5 F (36.9 C), Min:97.5 F (36.4 C), Max:99.3 F (37.4 C)   Recent Labs Lab 09/15/15 1929 09/15/15 1946 09/16/15 0010 09/16/15 0736 09/17/15 0228 09/17/15 1535  WBC 24.3*  --   --  16.4* 13.4*  --   CREATININE 4.50*  --   --  3.07* 2.03*  --   LATICACIDVEN  --  6.09* 3.09* 2.4*  --   --   VANCORANDOM  --   --   --   --   --  9    Estimated Creatinine Clearance: 43.2 mL/min (by C-G formula based on Cr of 2.03).    No Known Allergies  Antimicrobials this admission: 5/2 Vancomycin >> 5/2 Zosyn >> 5/3 Clindamycin >>  Dose adjustments this admission: 5/4 Vanc level = 9 after 2500mg  IV x1 on 5/2 >>  Microbiology results: 5/2 BCx: ngtd x2 days 5/3 Abscess Abd wall: Gm stain few GPR, cx pending 5/3 UCx: negative  5/3 MRSA PCR: negative  Thank you for allowing pharmacy to be a part of this patient's care.  Link SnufferJessica Hamna Asa, PharmD, BCPS Clinical Pharmacist [phone]  09/17/2015 5:05 PM

## 2015-09-18 DIAGNOSIS — J9601 Acute respiratory failure with hypoxia: Secondary | ICD-10-CM

## 2015-09-18 LAB — COMPREHENSIVE METABOLIC PANEL WITH GFR
ALT: 24 U/L (ref 14–54)
AST: 37 U/L (ref 15–41)
Albumin: 1.7 g/dL — ABNORMAL LOW (ref 3.5–5.0)
Alkaline Phosphatase: 56 U/L (ref 38–126)
Anion gap: 12 (ref 5–15)
BUN: 15 mg/dL (ref 6–20)
CO2: 26 mmol/L (ref 22–32)
Calcium: 8 mg/dL — ABNORMAL LOW (ref 8.9–10.3)
Chloride: 107 mmol/L (ref 101–111)
Creatinine, Ser: 1.57 mg/dL — ABNORMAL HIGH (ref 0.44–1.00)
GFR calc Af Amer: 40 mL/min — ABNORMAL LOW
GFR calc non Af Amer: 34 mL/min — ABNORMAL LOW
Glucose, Bld: 92 mg/dL (ref 65–99)
Potassium: 3.2 mmol/L — ABNORMAL LOW (ref 3.5–5.1)
Sodium: 145 mmol/L (ref 135–145)
Total Bilirubin: 1.6 mg/dL — ABNORMAL HIGH (ref 0.3–1.2)
Total Protein: 5.9 g/dL — ABNORMAL LOW (ref 6.5–8.1)

## 2015-09-18 LAB — CBC
HCT: 27.9 % — ABNORMAL LOW (ref 36.0–46.0)
Hemoglobin: 9.2 g/dL — ABNORMAL LOW (ref 12.0–15.0)
MCH: 26.3 pg (ref 26.0–34.0)
MCHC: 33 g/dL (ref 30.0–36.0)
MCV: 79.7 fL (ref 78.0–100.0)
Platelets: 268 K/uL (ref 150–400)
RBC: 3.5 MIL/uL — ABNORMAL LOW (ref 3.87–5.11)
RDW: 14.4 % (ref 11.5–15.5)
WBC: 11.9 K/uL — ABNORMAL HIGH (ref 4.0–10.5)

## 2015-09-18 MED ORDER — POTASSIUM CHLORIDE CRYS ER 20 MEQ PO TBCR
40.0000 meq | EXTENDED_RELEASE_TABLET | Freq: Two times a day (BID) | ORAL | Status: AC
  Administered 2015-09-18 (×2): 40 meq via ORAL
  Filled 2015-09-18 (×2): qty 2

## 2015-09-18 NOTE — Progress Notes (Signed)
[place] TEAM 1 - Stepdown/ICU TEAM PROGRESS NOTE  Annette Walker WLN:[id number] DOB: Jul 02, 1950 DOA: 09/15/2015 PCP: No PCP Per Patient  Admit HPI / Brief Narrative: 65 y.o. F admitted 05/03 with nec fasc of right lower pelvis requiring emergent I&D. Returned to the ICU on the vent post operatively. Extubated 5/3 am  Significant Events: 05/03 > admitted with [facility] of right lower pelvis > taken to OR for emergent I&D > returned to ICU on vent 5/4 transfer to [facility] service   HPI/Subjective: Resting comfortably.  No complaints appart from some pain at surgical site s/p placement of wound vac.  Denies cp, sob, or abdom pain.  Assessment/Plan:  Necrotizing fasciitis of right lower pelvis s/p emergent I&D 09/16/15- wound care per Gen Surgery - wound vac now placed - d/c Vanc as MRSA screen negative - further narrow abx as culture date becomes available   Post operative VDRF Rapidly resolved - resp status currently stable   Sinus Tachycardia Check TSH - likely due to BB withdrawal - resolved w/ resumption of Toprol XL  HTN BP well controlled   Hypokalemia Cont to replace and follow - Mg normal   AKI crt 4.5 at admit - steadily improving w/ volume - follow   Recent Labs Lab 09/15/15 1929 09/16/15 0736 09/17/15 0228 09/18/15 0330  CREATININE 4.50* 3.07* 2.03* 1.57*    Acute metabolic encephalopathy Likely toxic met enceph due to fasciitis - resolved   Morbid obesity - Body mass index is 53.15 kg/(m^2).  Code Status: FULL Family Communication: no family present at time of exam Disposition Plan: should be ready for transfer to med bed if remains stable over night - PT/OT - wound care per Gen Surgery    Consultants: PCCM Gen Surgery   Antibiotics: Clindamycin 5/2 > Zosyn 5/2 > Vanc 5/2 > 5/5  DVT prophylaxis: SQ heparin   Objective: Blood pressure 126/65, pulse 93, temperature 97.7 F (36.5 C), temperature source Oral, resp. rate 22, height 5' 6"  (1.676  m), weight 149.3 kg (329 lb 2.4 oz), SpO2 94 %.  Intake/Output Summary (Last 24 hours) at 09/18/15 1334 Last data filed at 09/18/15 0521  Gross per 24 hour  Intake   2750 ml  Output      0 ml  Net   2750 ml    Exam: General: No acute respiratory distress Lungs: Clear to auscultation bilaterally - no wheezes or crackles Cardiovascular: RRR w/o gallup or M Abdomen: morbidly obese, soft, bowel sounds positive, no rebound Extremities: No significant cyanosis, or clubbing; trace edema B lower extremities  Data Reviewed: Basic Metabolic Panel:  Recent Labs Lab 09/15/15 1929 09/16/15 0736 09/17/15 0228 09/18/15 0330  NA 136 142 142 145  K 2.9* 3.3* 3.1* 3.2*  CL 97* 110 108 107  CO2 18* 20* 23 26  GLUCOSE 136* 110* 102* 92  BUN 51* 42* 28* 15  CREATININE 4.50* 3.07* 2.03* 1.57*  CALCIUM 8.6* 7.6* 7.7* 8.0*  MG  --  1.9  --   --   PHOS  --  4.2  --   --     CBC:  Recent Labs Lab 09/15/15 1929 09/16/15 0736 09/17/15 0228 09/18/15 0330  WBC 24.3* 16.4* 13.4* 11.9*  NEUTROABS 21.0*  --   --   --   HGB 12.1 10.3* 9.3* 9.2*  HCT 35.4* 29.8* 27.1* 27.9*  MCV 80.3 78.2 77.4* 79.7  PLT 254 204 202 268    Liver Function Tests:  Recent Labs Lab 09/15/15  1929 09/16/15 0736 09/18/15 0330  AST 43* 37 37  ALT 23 21 24   ALKPHOS 73 57 56  BILITOT 1.7* 1.9* 1.6*  PROT 8.0 6.5 5.9*  ALBUMIN 2.5* 1.9* 1.7*   CBG:  Recent Labs Lab 09/16/15 0223 09/16/15 0407  GLUCAP 98 114*    Recent Results (from the past 240 hour(s))  Culture, blood (Routine x 2)     Status: None (Preliminary result)   Collection Time: 09/15/15  7:27 PM  Result Value Ref Range Status   Specimen Description BLOOD RIGHT ARM  Final   Special Requests   Final    BOTTLES DRAWN AEROBIC AND ANAEROBIC BLUE 10CC RED 5CC   Culture NO GROWTH 2 DAYS  Final   Report Status PENDING  Incomplete  Culture, blood (Routine x 2)     Status: None (Preliminary result)   Collection Time: 09/15/15  7:35 PM    Result Value Ref Range Status   Specimen Description BLOOD LEFT ARM  Final   Special Requests IN PEDIATRIC BOTTLE 4CC  Final   Culture NO GROWTH 2 DAYS  Final   Report Status PENDING  Incomplete  Anaerobic culture     Status: None (Preliminary result)   Collection Time: 09/16/15 12:57 AM  Result Value Ref Range Status   Specimen Description ABSCESS  Final   Special Requests ABDOMINAL WALL  Final   Gram Stain   Final    NO WBC SEEN NO SQUAMOUS EPITHELIAL CELLS SEEN FEW GRAM POSITIVE RODS Performed at [facility]    Culture   Final    NO ANAEROBES ISOLATED; CULTURE IN PROGRESS FOR 5 DAYS Performed at [facility]    Report Status PENDING  Incomplete  Culture, routine-abscess     Status: None (Preliminary result)   Collection Time: 09/16/15 12:57 AM  Result Value Ref Range Status   Specimen Description ABSCESS  Final   Special Requests ABDOMINAL WALL  Final   Gram Stain   Final    NO WBC SEEN NO SQUAMOUS EPITHELIAL CELLS SEEN FEW GRAM POSITIVE RODS Performed at [facility]    Culture   Final    NO GROWTH 1 DAY Performed at [facility]    Report Status PENDING  Incomplete  MRSA PCR Screening     Status: None   Collection Time: 09/16/15  2:00 AM  Result Value Ref Range Status   MRSA by PCR NEGATIVE NEGATIVE Final    Comment:        The GeneXpert MRSA Assay (FDA approved for NASAL specimens only), is one component of a comprehensive MRSA colonization surveillance program. It is not intended to diagnose MRSA infection nor to guide or monitor treatment for MRSA infections.   Urine culture     Status: None   Collection Time: 09/16/15  2:35 AM  Result Value Ref Range Status   Specimen Description URINE, CATHETERIZED  Final   Special Requests NONE  Final   Culture NO GROWTH 1 DAY  Final   Report Status 09/17/2015 FINAL  Final     Studies:   Recent x-ray studies have been reviewed in detail by the Attending Physician  Scheduled  Meds:  Scheduled Meds: . aspirin  81 mg Oral Daily  . clindamycin (CLEOCIN) IV  900 mg Intravenous Q8H  . heparin subcutaneous  5,000 Units Subcutaneous Q8H  . metoprolol succinate  50 mg Oral Daily  . piperacillin-tazobactam (ZOSYN)  IV  3.375 g Intravenous Q8H  . vancomycin  1,750 mg Intravenous Q24H    Time spent on care of this patient: 35 mins   Deontez Klinke T , MD   [facility] Office  [phone] Pager - Text Page per Shea Evans as per below:  On-Call/Text Page:      [email or web address]      password [email or web address]  If 7PM-7AM, please contact night-coverage [email or web address] Password [email or web address] 09/18/2015, 1:34 PM   LOS: 2 days

## 2015-09-18 NOTE — Consult Note (Addendum)
WOC wound consult note Reason for Consult: Consult requested for vac application to lower abd pannus fold.  Pt is followed by surgical team for assessment and plan of care. Site will be very difficult to maintain seal related to multiple folds, moist skin, and located  over perineum/hair areas Wound type: Full thickness post-op wound Measurement:  Difficult to obtain accurate measurements related to skin folds and irregular wound edges.  Approx 23X6X6cm Wound bed: Beefy red Drainage (amount, consistency, odor) Mod amt pink drainage, no odor Periwound: Intact skin surrounding  Dressing procedure/placement/frequency: Applied barrier rings to maintain seal to lower wound edges, then packed with 3 pieces black sponge and cont suction applied at 125mm.  Pt medicated for pain with IV meds prior to the procedure and tolerated with mod amt discomfort.  Plan dressing change Monday.  Will need to be able to tolerate dressing changes without IV meds prior to discharge home with Vac. Cammie Mcgeeawn Kenitha Glendinning MSN, RN, CWOCN, [place]-AP, CNS [phone]

## 2015-09-18 NOTE — Progress Notes (Signed)
3 Days Post-Op  Subjective: NAE overnight.  States that she is feeling well.  Ambulated in hallways yesterday.  Remains afebrile.  Objective: Vital signs in last 24 hours: Temp:  [97.4 F (36.3 C)-97.6 F (36.4 C)] 97.6 F (36.4 C) (05/05 0425) Pulse Rate:  [48-131] 80 (05/04 2000) Resp:  [21-34] 32 (05/04 2000) BP: (105-168)/(44-94) 114/77 mmHg (05/05 0425) SpO2:  [82 %-100 %] 100 % (05/04 2000) Last BM Date: 09/17/15  Intake/Output from previous day: 05/04 0701 - 05/05 0700 In: 3485 [P.O.:360; I.V.:2875; IV Piggyback:250] Out: 650 [Urine:650] Intake/Output this shift:    General appearance: alert, cooperative, appears stated age, no distress and morbidly obese Incision/Wound:R groin wound with dressing in place.  Dressing removed and packing removed.  There appears to be an overall healthy edge and base.  Some areas of eschar from bovie use.  Otherwise, no areas appear to need further debridement.  Lab Results:   Recent Labs  09/17/15 0228 09/18/15 0330  WBC 13.4* 11.9*  HGB 9.3* 9.2*  HCT 27.1* 27.9*  PLT 202 268   BMET  Recent Labs  09/17/15 0228 09/18/15 0330  NA 142 145  K 3.1* 3.2*  CL 108 107  CO2 23 26  GLUCOSE 102* 92  BUN 28* 15  CREATININE 2.03* 1.57*  CALCIUM 7.7* 8.0*   PT/INR No results for input(s): LABPROT, INR in the last 72 hours. ABG  Recent Labs  09/16/15 0348  PHART 7.292*  HCO3 19.6*    Studies/Results: No results found.  Anti-infectives: Anti-infectives    Start     Dose/Rate Route Frequency Ordered Stop   09/17/15 1715  vancomycin (VANCOCIN) 1,750 mg in sodium chloride 0.9 % 500 mL IVPB     1,750 mg 250 mL/hr over 120 Minutes Intravenous Every 24 hours 09/17/15 1714     09/16/15 1600  piperacillin-tazobactam (ZOSYN) IVPB 3.375 g     3.375 g 12.5 mL/hr over 240 Minutes Intravenous Every 8 hours 09/16/15 1513     09/16/15 0400  clindamycin (CLEOCIN) IVPB 900 mg     900 mg 100 mL/hr over 30 Minutes Intravenous Every 8  hours 09/16/15 0249     09/16/15 0215  piperacillin-tazobactam (ZOSYN) IVPB 3.375 g  Status:  Discontinued     3.375 g 12.5 mL/hr over 240 Minutes Intravenous Every 8 hours 09/16/15 0207 09/16/15 0210   09/16/15 0042  piperacillin-tazobactam (ZOSYN) IVPB 2.25 g  Status:  Discontinued     2.25 g 100 mL/hr over 30 Minutes Intravenous Every 8 hours 09/15/15 2048 09/16/15 1512   09/15/15 2130  clindamycin (CLEOCIN) IVPB 600 mg     600 mg 100 mL/hr over 30 Minutes Intravenous  Once 09/15/15 2120 09/16/15 0041   09/15/15 2030  piperacillin-tazobactam (ZOSYN) IVPB 3.375 g     3.375 g 100 mL/hr over 30 Minutes Intravenous STAT 09/15/15 2026 09/15/15 2236   09/15/15 2030  vancomycin (VANCOCIN) 2,500 mg in sodium chloride 0.9 % 500 mL IVPB     2,500 mg 250 mL/hr over 120 Minutes Intravenous  Once 09/15/15 2026 09/15/15 2310      Assessment/Plan: s/p Procedure(s): IRRIGATION AND DEBRIDEMENT ABDOMEN (N/A) Wound appears to be doing well.  WBC improving.  Will consult wound/ostomy to evaluate for wound vac as I think this would be easier for her to manage once discharged.  Continue to encourage high protein meals.  LOS: 2 days    Concha SeJoshua S Mycala Warshawsky 09/18/2015

## 2015-09-18 NOTE — Care Management Note (Signed)
Case Management Note  Patient Details  Name: Annette Walker MRN: [id number] Date of Birth: May 06, 1951  Subjective/Objective:    Wound [facility] has been applied, [facility] when pt is discharged.                          Expected Discharge Plan:  Home w Home Health Services  Discharge planning Services  CM Consult  Post Acute Care Choice:  Home Health Choice offered to:  Patient  DME Arranged:  Vac DME Agency:  [facility]  [facility]  Status of Service:  Completed, signed off  Magdalene RiverMayo, Stephanine Reas T, [place]RN 09/18/2015, 2:20 PM

## 2015-09-19 DIAGNOSIS — A419 Sepsis, unspecified organism: Secondary | ICD-10-CM | POA: Diagnosis not present

## 2015-09-19 DIAGNOSIS — E872 Acidosis: Secondary | ICD-10-CM | POA: Diagnosis not present

## 2015-09-19 DIAGNOSIS — N179 Acute kidney failure, unspecified: Secondary | ICD-10-CM | POA: Diagnosis not present

## 2015-09-19 DIAGNOSIS — M726 Necrotizing fasciitis: Secondary | ICD-10-CM | POA: Diagnosis not present

## 2015-09-19 DIAGNOSIS — E876 Hypokalemia: Secondary | ICD-10-CM | POA: Diagnosis not present

## 2015-09-19 DIAGNOSIS — G92 Toxic encephalopathy: Secondary | ICD-10-CM | POA: Diagnosis not present

## 2015-09-19 DIAGNOSIS — R6521 Severe sepsis with septic shock: Secondary | ICD-10-CM | POA: Diagnosis not present

## 2015-09-19 DIAGNOSIS — Z6841 Body Mass Index (BMI) 40.0 and over, adult: Secondary | ICD-10-CM | POA: Diagnosis not present

## 2015-09-19 DIAGNOSIS — J95821 Acute postprocedural respiratory failure: Secondary | ICD-10-CM | POA: Diagnosis not present

## 2015-09-19 DIAGNOSIS — J9600 Acute respiratory failure, unspecified whether with hypoxia or hypercapnia: Secondary | ICD-10-CM | POA: Insufficient documentation

## 2015-09-19 LAB — CBC
HCT: 26.9 % — ABNORMAL LOW (ref 36.0–46.0)
HEMOGLOBIN: 8.9 g/dL — AB (ref 12.0–15.0)
MCH: 26.5 pg (ref 26.0–34.0)
MCHC: 33.1 g/dL (ref 30.0–36.0)
MCV: 80.1 fL (ref 78.0–100.0)
PLATELETS: 258 10*3/uL (ref 150–400)
RBC: 3.36 MIL/uL — AB (ref 3.87–5.11)
RDW: 14.5 % (ref 11.5–15.5)
WBC: 12.3 10*3/uL — AB (ref 4.0–10.5)

## 2015-09-19 LAB — BASIC METABOLIC PANEL
ANION GAP: 9 (ref 5–15)
BUN: 9 mg/dL (ref 6–20)
CALCIUM: 7.7 mg/dL — AB (ref 8.9–10.3)
CO2: 23 mmol/L (ref 22–32)
Chloride: 108 mmol/L (ref 101–111)
Creatinine, Ser: 1.16 mg/dL — ABNORMAL HIGH (ref 0.44–1.00)
GFR, EST AFRICAN AMERICAN: 57 mL/min — AB (ref >60)
GFR, EST NON AFRICAN AMERICAN: 49 mL/min — AB (ref >60)
Glucose, Bld: 125 mg/dL — ABNORMAL HIGH (ref 65–99)
Potassium: 3.4 mmol/L — ABNORMAL LOW (ref 3.5–5.1)
SODIUM: 140 mmol/L (ref 135–145)

## 2015-09-19 LAB — TSH: TSH: 1.436 u[IU]/mL (ref 0.350–4.500)

## 2015-09-19 MED ORDER — SODIUM CHLORIDE 0.9 % IV SOLN
INTRAVENOUS | Status: DC
  Administered 2015-09-19 (×2): via INTRAVENOUS
  Filled 2015-09-19 (×5): qty 1000

## 2015-09-19 NOTE — Progress Notes (Addendum)
[facility] TEAM 1 - Stepdown/ICU TEAM PROGRESS NOTE  Annette Walker GHW:[id number] DOB: Sep 15, 1950 DOA: 09/15/2015 PCP: No PCP Per Patient  Admit HPI / Brief Narrative: 65 y.o. F admitted 05/03 with nec fasc of right lower pelvis requiring emergent I&D. Returned to the ICU on the vent post operatively. Extubated 5/3 .  Significant Events: 05/03 > admitted with [facility] of right lower pelvis > taken to OR for emergent I&D > returned to ICU on vent 5/4 transfer to [facility] service .  HPI/Subjective: Sitting comfortably in the chair.  No complaints appart from some pain at surgical site s/p placement of wound vac.  Denies cp, sob, or abdom pain.  Assessment/Plan:  Necrotizing fasciitis of right lower pelvis s/p emergent I&D 09/16/15- wound care per Gen Surgery - wound vac now placed - d/c Vanc as MRSA screen negative - further narrow abx as culture date becomes available . Currently on Zosyn and clindamycin Wound culture shows few gram-positive rods Blood culture/urine culture no growth so far Improving transfer to telemetry  Post operative VDRF Rapidly resolved - resp status currently stable   Sinus Tachycardia with PACs Check TSH - likely due to BB withdrawal - resolved w/ resumption of Toprol XL EKG, to evaluate for arrhythmia  HTN BP well controlled   Hypokalemia Cont to replace and follow - Mg normal    AKI crt 4.5 at admit - steadily improving w/ volume - follow   Recent Labs Lab 09/15/15 1929 09/16/15 0736 09/17/15 0228 09/18/15 0330 09/19/15 0309  CREATININE 4.50* 3.07* 2.03* 1.57* 1.16*     Acute metabolic encephalopathy Likely toxic met enceph due to fasciitis - resolved   Morbid obesity - Body mass index is 53.15 kg/(m^2).  Code Status: FULL Family Communication: no family present at time of exam Disposition Plan: Transfer to telemetry - PT/OT - wound care per Gen Surgery  ,  Consultants: PCCM Gen Surgery   Antibiotics: Clindamycin 5/2 > Zosyn  5/2 > Vanc 5/2 > 5/5  DVT prophylaxis: SQ heparin   Objective: Blood pressure 115/64, pulse 93, temperature 99.1 F (37.3 C), temperature source Oral, resp. rate 16, height '5\' 6"'$  (1.676 m), weight 149.3 kg (329 lb 2.4 oz), SpO2 98 %.  Intake/Output Summary (Last 24 hours) at 09/19/15 0853 Last data filed at 09/19/15 0600  Gross per 24 hour  Intake 2132.33 ml  Output      0 ml  Net 2132.33 ml    Exam: General: No acute respiratory distress Lungs: Clear to auscultation bilaterally - no wheezes or crackles Cardiovascular: RRR w/o gallup or M Abdomen: morbidly obese, soft, bowel sounds positive, no rebound Extremities: No significant cyanosis, or clubbing; trace edema B lower extremities  Data Reviewed: Basic Metabolic Panel:  Recent Labs Lab 09/15/15 1929 09/16/15 0736 09/17/15 0228 09/18/15 0330 09/19/15 0309  NA 136 142 142 145 140  K 2.9* 3.3* 3.1* 3.2* 3.4*  CL 97* 110 108 107 108  CO2 18* 20* '23 26 23  '$ GLUCOSE 136* 110* 102* 92 125*  BUN 51* 42* 28* 15 9  CREATININE 4.50* 3.07* 2.03* 1.57* 1.16*  CALCIUM 8.6* 7.6* 7.7* 8.0* 7.7*  MG  --  1.9  --   --   --   PHOS  --  4.2  --   --   --     CBC:  Recent Labs Lab 09/15/15 1929 09/16/15 0736 09/17/15 0228 09/18/15 0330 09/19/15 0309  WBC 24.3* 16.4* 13.4* 11.9* 12.3*  NEUTROABS 21.0*  --   --   --   --  HGB 12.1 10.3* 9.3* 9.2* 8.9*  HCT 35.4* 29.8* 27.1* 27.9* 26.9*  MCV 80.3 78.2 77.4* 79.7 80.1  PLT 254 204 202 268 258    Liver Function Tests:  Recent Labs Lab 09/15/15 1929 09/16/15 0736 09/18/15 0330  AST 43* 37 37  ALT _0 ALKPHOS 73 57 56  BILITOT 1.7* 1.9* 1.6*  PROT 8.0 6.5 5.9*  ALBUMIN 2.5* 1.9* 1.7*   CBG:  Recent Labs Lab 09/16/15 0223 09/16/15 0407  GLUCAP 98 114*    Recent Results (from the past 240 hour(s))  Culture, blood (Routine x 2)     Status: None (Preliminary result)   Collection Time: 09/15/15  7:27 PM  Result Value Ref Range Status   Specimen  Description BLOOD RIGHT ARM  Final   Special Requests   Final    BOTTLES DRAWN AEROBIC AND ANAEROBIC BLUE 10CC RED 5CC   Culture NO GROWTH 3 DAYS  Final   Report Status PENDING  Incomplete  Culture, blood (Routine x 2)     Status: None (Preliminary result)   Collection Time: 09/15/15  7:35 PM  Result Value Ref Range Status   Specimen Description BLOOD LEFT ARM  Final   Special Requests IN PEDIATRIC BOTTLE 4CC  Final   Culture NO GROWTH 3 DAYS  Final   Report Status PENDING  Incomplete  Anaerobic culture     Status: None (Preliminary result)   Collection Time: 09/16/15 12:57 AM  Result Value Ref Range Status   Specimen Description ABSCESS  Final   Special Requests ABDOMINAL WALL  Final   Gram Stain   Final    NO WBC SEEN NO SQUAMOUS EPITHELIAL CELLS SEEN FEW GRAM POSITIVE RODS Performed at [facility]    Culture   Final    NO ANAEROBES ISOLATED; CULTURE IN PROGRESS FOR 5 DAYS Performed at [facility]    Report Status PENDING  Incomplete  Culture, routine-abscess     Status: None (Preliminary result)   Collection Time: 09/16/15 12:57 AM  Result Value Ref Range Status   Specimen Description ABSCESS  Final   Special Requests ABDOMINAL WALL  Final   Gram Stain   Final    NO WBC SEEN NO SQUAMOUS EPITHELIAL CELLS SEEN FEW GRAM POSITIVE RODS Performed at [facility]    Culture   Final    NO GROWTH 1 DAY Performed at [facility]    Report Status PENDING  Incomplete  MRSA PCR Screening     Status: None   Collection Time: 09/16/15  2:00 AM  Result Value Ref Range Status   MRSA by PCR NEGATIVE NEGATIVE Final    Comment:        The GeneXpert MRSA Assay (FDA approved for NASAL specimens only), is one component of a comprehensive MRSA colonization surveillance program. It is not intended to diagnose MRSA infection nor to guide or monitor treatment for MRSA infections.   Urine culture     Status: None   Collection Time: 09/16/15  2:35 AM   Result Value Ref Range Status   Specimen Description URINE, CATHETERIZED  Final   Special Requests NONE  Final   Culture NO GROWTH 1 DAY  Final   Report Status 09/17/2015 FINAL  Final     Studies:   Recent x-ray studies have been reviewed in detail by the Attending Physician  Scheduled Meds:  Scheduled Meds: . aspirin  81 mg Oral Daily  . clindamycin (CLEOCIN) IV  900 mg Intravenous Q8H  . heparin subcutaneous  5,000 Units Subcutaneous Q8H  . metoprolol succinate  50 mg Oral Daily  . piperacillin-tazobactam (ZOSYN)  IV  3.375 g Intravenous Q8H    Time spent on care of this patient: 18 mins   Reyne Dumas , MD   [facility] Office  [phone] Pager - Text Page per Shea Evans as per below:  On-Call/Text Page:      [email or web address]      password [email or web address]  If 7PM-7AM, please contact night-coverage [email or web address] Password [email or web address] 09/19/2015, 8:53 AM   LOS: 3 days

## 2015-09-19 NOTE — Progress Notes (Signed)
[facility] and patient transferred to [phone]

## 2015-09-19 NOTE — Progress Notes (Signed)
Pt arrived to unit with IV pole and NPWT. Pt pleasant and family at bedside. Pt asking for cell phone left in care of security. Will locate possession form and retrieve. IV site edematous, cool to the touch and mildly firm around insertion site. Order placed for IV team to assess. No other concerns at this time. Lawson RadarHeather M Sutton Plake

## 2015-09-20 DIAGNOSIS — E872 Acidosis: Secondary | ICD-10-CM

## 2015-09-20 LAB — CULTURE, BLOOD (ROUTINE X 2)
CULTURE: NO GROWTH
Culture: NO GROWTH

## 2015-09-20 LAB — CULTURE, ROUTINE-ABSCESS: Gram Stain: NONE SEEN

## 2015-09-20 LAB — CBC
HCT: 27.5 % — ABNORMAL LOW (ref 36.0–46.0)
HEMOGLOBIN: 9 g/dL — AB (ref 12.0–15.0)
MCH: 26.2 pg (ref 26.0–34.0)
MCHC: 32.7 g/dL (ref 30.0–36.0)
MCV: 79.9 fL (ref 78.0–100.0)
Platelets: 260 10*3/uL (ref 150–400)
RBC: 3.44 MIL/uL — ABNORMAL LOW (ref 3.87–5.11)
RDW: 14.7 % (ref 11.5–15.5)
WBC: 13.1 10*3/uL — ABNORMAL HIGH (ref 4.0–10.5)

## 2015-09-20 LAB — COMPREHENSIVE METABOLIC PANEL
ALBUMIN: 1.6 g/dL — AB (ref 3.5–5.0)
ALT: 22 U/L (ref 14–54)
ANION GAP: 8 (ref 5–15)
AST: 31 U/L (ref 15–41)
Alkaline Phosphatase: 51 U/L (ref 38–126)
BUN: 6 mg/dL (ref 6–20)
CO2: 25 mmol/L (ref 22–32)
Calcium: 8 mg/dL — ABNORMAL LOW (ref 8.9–10.3)
Chloride: 109 mmol/L (ref 101–111)
Creatinine, Ser: 1.22 mg/dL — ABNORMAL HIGH (ref 0.44–1.00)
GFR calc Af Amer: 54 mL/min — ABNORMAL LOW (ref >60)
GFR calc non Af Amer: 46 mL/min — ABNORMAL LOW (ref >60)
Glucose, Bld: 103 mg/dL — ABNORMAL HIGH (ref 65–99)
POTASSIUM: 3.4 mmol/L — AB (ref 3.5–5.1)
SODIUM: 142 mmol/L (ref 135–145)
TOTAL PROTEIN: 6.1 g/dL — AB (ref 6.5–8.1)
Total Bilirubin: 0.7 mg/dL (ref 0.3–1.2)

## 2015-09-20 MED ORDER — POTASSIUM CHLORIDE CRYS ER 20 MEQ PO TBCR
40.0000 meq | EXTENDED_RELEASE_TABLET | Freq: Once | ORAL | Status: AC
  Administered 2015-09-20: 40 meq via ORAL
  Filled 2015-09-20: qty 2

## 2015-09-20 MED ORDER — ENOXAPARIN SODIUM 80 MG/0.8ML ~~LOC~~ SOLN
70.0000 mg | SUBCUTANEOUS | Status: DC
  Administered 2015-09-20: 70 mg via SUBCUTANEOUS
  Filled 2015-09-20: qty 0.8

## 2015-09-20 MED ORDER — METOPROLOL SUCCINATE ER 25 MG PO TB24
25.0000 mg | ORAL_TABLET | Freq: Every day | ORAL | Status: DC
Start: 2015-09-21 — End: 2015-09-21
  Administered 2015-09-21: 25 mg via ORAL
  Filled 2015-09-20: qty 1

## 2015-09-20 MED ORDER — HYDROCODONE-ACETAMINOPHEN 5-325 MG PO TABS
1.0000 | ORAL_TABLET | Freq: Four times a day (QID) | ORAL | Status: DC | PRN
  Administered 2015-09-21 (×2): 1 via ORAL
  Filled 2015-09-20 (×2): qty 1

## 2015-09-20 MED ORDER — POLYETHYLENE GLYCOL 3350 17 G PO PACK: 17.0000 g | PACK | Freq: Every day | ORAL | Status: DC | PRN

## 2015-09-20 MED ORDER — DOCUSATE SODIUM 100 MG PO CAPS
100.0000 mg | ORAL_CAPSULE | Freq: Two times a day (BID) | ORAL | Status: DC
  Administered 2015-09-20 – 2015-09-21 (×3): 100 mg via ORAL
  Filled 2015-09-20 (×2): qty 1

## 2015-09-20 MED ORDER — SACCHAROMYCES BOULARDII 250 MG PO CAPS
250.0000 mg | ORAL_CAPSULE | Freq: Two times a day (BID) | ORAL | Status: DC
  Administered 2015-09-20 – 2015-09-21 (×2): 250 mg via ORAL
  Filled 2015-09-20 (×2): qty 1

## 2015-09-20 NOTE — Progress Notes (Signed)
Pharmacy Antibiotic Note  Annette Walker is a 65 y.o. obese female admitted on 09/15/2015 with sepsis- abdominal wall abscess, nec fasciitis.  Pharmacy has been consulted for Zosyn dosing.  The patient continues on Zosyn per Rx + Clinda per MD for necrotizing fasciitis coverage, s/p I&D with wound vac in place. Abscess cultures grew multiple species - none predominant. Renal function stable, doses appropriate.   Plan: 1. Continue Zosyn 3.375g IV every 8 hours EI 2. Continue Clindamycin per MD 3. Will continue to follow renal function, culture results, LOT, and antibiotic de-escalation plans    Height: 5\' 6"  (167.6 cm) Weight: (!) 329 lb 2.4 oz (149.3 kg) IBW/kg (Calculated) : 59.3  Temp (24hrs), Avg:98.7 F (37.1 C), Min:98.1 F (36.7 C), Max:99 F (37.2 C)   Recent Labs Lab 09/15/15 1946 09/16/15 0010 09/16/15 0736 09/17/15 0228 09/17/15 1535 09/18/15 0330 09/19/15 0309 09/20/15 0233  WBC  --   --  16.4* 13.4*  --  11.9* 12.3* 13.1*  CREATININE  --   --  3.07* 2.03*  --  1.57* 1.16* 1.22*  LATICACIDVEN 6.09* 3.09* 2.4*  --   --   --   --   --   VANCORANDOM  --   --   --   --  9  --   --   --     Estimated Creatinine Clearance: 71.9 mL/min (by C-G formula based on Cr of 1.22).    No Known Allergies  Antimicrobials this admission: 5/2 Vancomycin >> 5/6 5/2 Zosyn >> 5/3 Clindamycin >>  Dose adjustments this admission: 5/4 Vanc level = 9 after 2500mg  IV x1 on 5/2 >>  Microbiology results: 5/2 BCx: ngtd x2 days 5/3 Abscess Abd wall: multiple organisms, none predominant 5/3 UCx: negative  5/3 MRSA PCR: negative  Thank you for allowing pharmacy to be a part of this patient's care.  Georgina PillionElizabeth Sevastian Witczak, PharmD, BCPS Clinical Pharmacist Pager: [phone] 09/20/2015 1:43 PM

## 2015-09-20 NOTE — Progress Notes (Signed)
5 Days Post-Op  Subjective: PT IN [facility] SPIRITS   Objective: Vital signs in last 24 hours: Temp:  [98.1 F (36.7 C)-99 F (37.2 C)] 99 F (37.2 C) (05/07 0951) Pulse Rate:  [67-80] 67 (05/07 0951) Resp:  [18-26] 18 (05/07 0951) BP: (94-133)/(44-79) 108/51 mmHg (05/07 0951) SpO2:  [95 %-100 %] 95 % (05/07 0951) Last BM Date: 09/19/15  Intake/Output from previous day: 05/06 0701 - 05/07 0700 In: 1363.8 [P.O.:820; I.V.:393.8; IV Piggyback:150] Out: -  Intake/Output this shift:    RIGHT GROIN WOUND VAC IN PLACE  Lab Results:   Recent Labs  09/19/15 0309 09/20/15 0233  WBC 12.3* 13.1*  HGB 8.9* 9.0*  HCT 26.9* 27.5*  PLT 258 260   BMET  Recent Labs  09/19/15 0309 09/20/15 0233  NA 140 142  K 3.4* 3.4*  CL 108 109  CO2 23 25  GLUCOSE 125* 103*  BUN 9 6  CREATININE 1.16* 1.22*  CALCIUM 7.7* 8.0*   PT/INR No results for input(s): LABPROT, INR in the last 72 hours. ABG No results for input(s): PHART, HCO3 in the last 72 hours.  Invalid input(s): PCO2, PO2  Studies/Results: No results found.  Anti-infectives: Anti-infectives    Start     Dose/Rate Route Frequency Ordered Stop   09/17/15 1715  vancomycin (VANCOCIN) 1,750 mg in sodium chloride 0.9 % 500 mL IVPB  Status:  Discontinued     1,750 mg 250 mL/hr over 120 Minutes Intravenous Every 24 hours 09/17/15 1714 09/18/15 1445   09/16/15 1600  piperacillin-tazobactam (ZOSYN) IVPB 3.375 g     3.375 g 12.5 mL/hr over 240 Minutes Intravenous Every 8 hours 09/16/15 1513     09/16/15 0400  clindamycin (CLEOCIN) IVPB 900 mg     900 mg 100 mL/hr over 30 Minutes Intravenous Every 8 hours 09/16/15 0249     09/16/15 0215  piperacillin-tazobactam (ZOSYN) IVPB 3.375 g  Status:  Discontinued     3.375 g 12.5 mL/hr over 240 Minutes Intravenous Every 8 hours 09/16/15 0207 09/16/15 0210   09/16/15 0042  piperacillin-tazobactam (ZOSYN) IVPB 2.25 g  Status:  Discontinued     2.25 g 100 mL/hr over 30 Minutes Intravenous  Every 8 hours 09/15/15 2048 09/16/15 1512   09/15/15 2130  clindamycin (CLEOCIN) IVPB 600 mg     600 mg 100 mL/hr over 30 Minutes Intravenous  Once 09/15/15 2120 09/16/15 0041   09/15/15 2030  piperacillin-tazobactam (ZOSYN) IVPB 3.375 g     3.375 g 100 mL/hr over 30 Minutes Intravenous STAT 09/15/15 2026 09/15/15 2236   09/15/15 2030  vancomycin (VANCOCIN) 2,500 mg in sodium chloride 0.9 % 500 mL IVPB     2,500 mg 250 mL/hr over 120 Minutes Intravenous  Once 09/15/15 2026 09/15/15 2310      Assessment/Plan: s/p Procedure(s): IRRIGATION AND DEBRIDEMENT ABDOMEN (N/A) CHANGE WOUND VAC ON Monday   LOS: 4 days    Annette Walker A. 09/20/2015

## 2015-09-20 NOTE — Progress Notes (Signed)
PROGRESS NOTE  RUE VALLADARES  YQI:[id number] DOB: Jul 13, 1950 DOA: 09/15/2015 PCP: No PCP Per Patient Outpatient Specialists:  None  Brief Narrative:   65 y.o. F admitted 05/03 with nec fasc of right lower pelvis requiring emergent I&D. Returned to the ICU on the vent post operatively. Extubated 5/3 .  Assessment & Plan:   Active Problems:   Open abdominal wall wound   Necrotizing fasciitis (HCC)   Abdominal pain, acute   Acute hypoxemic respiratory failure (HCC)   Respiratory failure (HCC)   Lactic acidosis   AKI (acute kidney injury) ([place])   Acute respiratory failure (HCC)   Sepsis secondary to necrotizing fasciitis of right lower pelvis s/p emergent I&D 09/16/15 with leukocytosis, tachycardia, lactic acidosis, acute renal failure - sepsis physiology resolved - wound care per Gen Surgery - wound vac now placed - Continue Zosyn and clindamycin  - Start florastor - Wound culture grew mixed flora, no Staph or S. Pyogenes (GAS)  Post operative VDRF Rapidly resolved - resp status currently stable   Sinus tachycardia with PACs, likely secondary to sepsis and having her BB held.   -  TSH 1.436 -  Tele:  NSR -  Okay to d/c telemetry  HTN, blood pressure low normal -  Decrease metoprolol slightly  Hypokalemia Cont to replace and follow - Mg normal   AKI, creatinine 4.5 and trended down to her baseline of 1.2 with IVF  Acute metabolic encephalopathy Likely toxic met enceph due to fasciitis - resolved   Morbid obesity - Body mass index is 53.15 kg/(m^2).  DVT prophylaxis:  Change to lovenox  Code Status:  Full  Family Communication:  Patient alone Disposition Plan:  Home possibly with wound vac and home health services as early as tomorrow.  Would treat for minimum of 7-days of antibiotics from time of debridement.  Consultants:   General surgery  PCCM  Antibiotics: Clindamycin 5/2 > Zosyn 5/2 > Vanc 5/2 > 5/5  DVT prophylaxis: SQ heparin    Subjective: Having some discomfort/pain in the area of the wound vac.  When first turned on, the wound vac is painful.  Denies fevers, chills.  Good appetite, having BMs and voiding regularly.    Objective: Filed Vitals:   09/20/15 0150 09/20/15 0639 09/20/15 0951 09/20/15 1409  BP: 113/44 127/44 108/51 117/60  Pulse: 69 72 67 73  Temp: 98.7 F (37.1 C) 98.1 F (36.7 C) 99 F (37.2 C) 97.3 F (36.3 C)  TempSrc: Oral Oral Oral Oral  Resp: 20 18 18 18   Height:      Weight:      SpO2: 95% 97% 95% 97%    Intake/Output Summary (Last 24 hours) at 09/20/15 1706 Last data filed at 09/20/15 1242  Gross per 24 hour  Intake    480 ml  Output      0 ml  Net    480 ml   Filed Weights   09/15/15 1916 09/17/15 0600  Weight: 144.697 kg (319 lb) 149.3 kg (329 lb 2.4 oz)    Examination:  General exam:  Adult female, no acute distress,  HEENT:  NCAT, MMM Respiratory system: Clear to auscultation. Respiratory effort normal. Cardiovascular system: S1 & S2 heard, RRR. No JVD, murmurs, rubs, gallops or clicks.  Warm extremities Gastrointestinal system: Abdomen is nondistended, soft and nontender.  Normal bowel sounds heard.   Skin: Wound vac in place over suprapubic area with no surrounding induration or erythema.  Good seal on wound vac.  MSK:  Normal tone and bulk, no lower extremity edema Neuro:  Grossly intact Psychiatry: Judgement and insight appear normal.  Tearful.      Data Reviewed: I have personally reviewed following labs and imaging studies  CBC:  Recent Labs Lab 09/15/15 1929 09/16/15 0736 09/17/15 0228 09/18/15 0330 09/19/15 0309 09/20/15 0233  WBC 24.3* 16.4* 13.4* 11.9* 12.3* 13.1*  NEUTROABS 21.0*  --   --   --   --   --   HGB 12.1 10.3* 9.3* 9.2* 8.9* 9.0*  HCT 35.4* 29.8* 27.1* 27.9* 26.9* 27.5*  MCV 80.3 78.2 77.4* 79.7 80.1 79.9  PLT 254 204 202 268 258 482   Basic Metabolic Panel:  Recent Labs Lab 09/16/15 0736 09/17/15 0228 09/18/15 0330  09/19/15 0309 09/20/15 0233  NA 142 142 145 140 142  K 3.3* 3.1* 3.2* 3.4* 3.4*  CL 110 108 107 108 109  CO2 20* 23 26 23 25   GLUCOSE 110* 102* 92 125* 103*  BUN 42* 28* 15 9 6   CREATININE 3.07* 2.03* 1.57* 1.16* 1.22*  CALCIUM 7.6* 7.7* 8.0* 7.7* 8.0*  MG 1.9  --   --   --   --   PHOS 4.2  --   --   --   --    GFR: Estimated Creatinine Clearance: 71.9 mL/min (by C-G formula based on Cr of 1.22). Liver Function Tests:  Recent Labs Lab 09/15/15 1929 09/16/15 0736 09/18/15 0330 09/20/15 0233  AST 43* 37 37 31  ALT 23 21 24 22   ALKPHOS 73 57 56 51  BILITOT 1.7* 1.9* 1.6* 0.7  PROT 8.0 6.5 5.9* 6.1*  ALBUMIN 2.5* 1.9* 1.7* 1.6*   No results for input(s): LIPASE, AMYLASE in the last 168 hours. No results for input(s): AMMONIA in the last 168 hours. Coagulation Profile: No results for input(s): INR, PROTIME in the last 168 hours. Cardiac Enzymes: No results for input(s): CKTOTAL, CKMB, CKMBINDEX, TROPONINI in the last 168 hours. BNP (last 3 results) No results for input(s): PROBNP in the last 8760 hours. HbA1C: No results for input(s): HGBA1C in the last 72 hours. CBG:  Recent Labs Lab 09/16/15 0223 09/16/15 0407  GLUCAP 98 114*   Lipid Profile: No results for input(s): CHOL, HDL, LDLCALC, TRIG, CHOLHDL, LDLDIRECT in the last 72 hours. Thyroid Function Tests:  Recent Labs  09/19/15 0309  TSH 1.436   Anemia Panel: No results for input(s): VITAMINB12, FOLATE, FERRITIN, TIBC, IRON, RETICCTPCT in the last 72 hours. Urine analysis:    Component Value Date/Time   COLORURINE YELLOW 09/16/2015 0235   APPEARANCEUR CLOUDY* 09/16/2015 0235   LABSPEC 1.015 09/16/2015 0235   PHURINE 5.0 09/16/2015 0235   GLUCOSEU NEGATIVE 09/16/2015 0235   HGBUR MODERATE* 09/16/2015 0235   BILIRUBINUR NEGATIVE 09/16/2015 0235   KETONESUR NEGATIVE 09/16/2015 0235   PROTEINUR NEGATIVE 09/16/2015 0235   NITRITE NEGATIVE 09/16/2015 0235   LEUKOCYTESUR NEGATIVE 09/16/2015 0235    Sepsis Labs: @LABRCNTIP (procalcitonin:4,lacticidven:4)  ) Recent Results (from the past 240 hour(s))  Culture, blood (Routine x 2)     Status: None (Preliminary result)   Collection Time: 09/15/15  7:27 PM  Result Value Ref Range Status   Specimen Description BLOOD RIGHT ARM  Final   Special Requests   Final    BOTTLES DRAWN AEROBIC AND ANAEROBIC BLUE 10CC RED 5CC   Culture NO GROWTH 4 DAYS  Final   Report Status PENDING  Incomplete  Culture, blood (Routine x 2)     Status: None (Preliminary  result)   Collection Time: 09/15/15  7:35 PM  Result Value Ref Range Status   Specimen Description BLOOD LEFT ARM  Final   Special Requests IN PEDIATRIC BOTTLE 4CC  Final   Culture NO GROWTH 4 DAYS  Final   Report Status PENDING  Incomplete  Anaerobic culture     Status: None (Preliminary result)   Collection Time: 09/16/15 12:57 AM  Result Value Ref Range Status   Specimen Description ABSCESS  Final   Special Requests ABDOMINAL WALL  Final   Gram Stain   Final    NO WBC SEEN NO SQUAMOUS EPITHELIAL CELLS SEEN FEW GRAM POSITIVE RODS Performed at [facility]    Culture   Final    NO ANAEROBES ISOLATED; CULTURE IN PROGRESS FOR 5 DAYS Performed at [facility]    Report Status PENDING  Incomplete  Culture, routine-abscess     Status: None   Collection Time: 09/16/15 12:57 AM  Result Value Ref Range Status   Specimen Description ABSCESS  Final   Special Requests ABDOMINAL WALL  Final   Gram Stain   Final    NO WBC SEEN NO SQUAMOUS EPITHELIAL CELLS SEEN FEW GRAM POSITIVE RODS Performed at [facility]    Culture   Final    MULTIPLE ORGANISMS PRESENT, NONE PREDOMINANT Note: NO STAPHYLOCOCCUS AUREUS ISOLATED NO GROUP A STREP (S.PYOGENES) ISOLATED Performed at [facility]    Report Status 09/20/2015 FINAL  Final  MRSA PCR Screening     Status: None   Collection Time: 09/16/15  2:00 AM  Result Value Ref Range Status   MRSA by PCR NEGATIVE  NEGATIVE Final    Comment:        The GeneXpert MRSA Assay (FDA approved for NASAL specimens only), is one component of a comprehensive MRSA colonization surveillance program. It is not intended to diagnose MRSA infection nor to guide or monitor treatment for MRSA infections.   Urine culture     Status: None   Collection Time: 09/16/15  2:35 AM  Result Value Ref Range Status   Specimen Description URINE, CATHETERIZED  Final   Special Requests NONE  Final   Culture NO GROWTH 1 DAY  Final   Report Status 09/17/2015 FINAL  Final      Radiology Studies: No results found.   Scheduled Meds: . aspirin  81 mg Oral Daily  . clindamycin (CLEOCIN) IV  900 mg Intravenous Q8H  . docusate sodium  100 mg Oral BID  . heparin subcutaneous  5,000 Units Subcutaneous Q8H  . [START ON 09/21/2015] metoprolol succinate  25 mg Oral Daily  . piperacillin-tazobactam (ZOSYN)  IV  3.375 g Intravenous Q8H   Continuous Infusions:    LOS: 4 days    Time spent: 30 min    Janece Canterbury, MD [facility] Pager [phone]  If 7PM-7AM, please contact night-coverage [email or web address] Password [email or web address] 09/20/2015, 5:06 PM

## 2015-09-20 NOTE — Progress Notes (Signed)
Pharmacy Consult Note Lovenox for VTE ppx  64 yof previously on hep SQ for VTE ppx. Pharmacy consulted to switch to lovenox. CrCl>30, weight~149kg. CBC stable, no bleed documented.  Received hep SQ dose at 1326 this afternoon.   Plan: Lovenox 70mg  (~0.5 mg/kg) [place] q24h for BMI>30 - start at 2130 Monitor CBC, s/sx bleeding Rx will sign off protocol  Babs BertinHaley Arcola Freshour, PharmD, [facility]grass Medical CenterBCPS Clinical Pharmacist Pager [phone] 09/20/2015 5:26 PM

## 2015-09-21 DIAGNOSIS — D509 Iron deficiency anemia, unspecified: Secondary | ICD-10-CM

## 2015-09-21 LAB — ANAEROBIC CULTURE: Gram Stain: NONE SEEN

## 2015-09-21 MED ORDER — AMOXICILLIN-POT CLAVULANATE 875-125 MG PO TABS
1.0000 | ORAL_TABLET | Freq: Two times a day (BID) | ORAL | Status: DC
  Administered 2015-09-21: 1 via ORAL

## 2015-09-21 MED ORDER — AMOXICILLIN-POT CLAVULANATE 875-125 MG PO TABS: 1.0000 | ORAL_TABLET | Freq: Two times a day (BID) | ORAL | Status: DC

## 2015-09-21 MED ORDER — HYDROCODONE-ACETAMINOPHEN 5-325 MG PO TABS: 1.0000 | ORAL_TABLET | Freq: Four times a day (QID) | ORAL | Status: DC | PRN

## 2015-09-21 MED ORDER — METOPROLOL SUCCINATE ER 25 MG PO TB24: 25.0000 mg | ORAL_TABLET | Freq: Every day | ORAL | Status: DC

## 2015-09-21 MED ORDER — SACCHAROMYCES BOULARDII 250 MG PO CAPS: 250.0000 mg | ORAL_CAPSULE | Freq: Two times a day (BID) | ORAL | Status: DC

## 2015-09-21 NOTE — Consult Note (Signed)
WOC wound follow up Pt plans to discharge home with [facility] and their brand of negative pressure device.  Discussed with the unit care manager that their company should come to set up machine and connect the dressing to the device prior to discharge.  She called to confirm and a rep from their agency will perform this task this afternoon. Please re-consult if further assistance is needed.  Thank-you,  Cammie Mcgeeawn Harman Langhans MSN, RN, CWOCN, [place], CNS [phone]

## 2015-09-21 NOTE — Progress Notes (Signed)
Physical Therapy Treatment Patient Details Name: Annette Walker MRN: [id number] DOB: Jul 24, 1950 Today's Date: 09/21/2015    History of Present Illness Pt adm with necrotizing fasciitis of lower abdomen and sepsis. Underwent I&D on 09/15/15. PMH - HTN, morbid obesity    PT Comments    Patient progressing well towards PT goals. Pain seems improved today. Encouraged ambulation while in hospital. Will need to attempt ambulation without UE support next session with higher level balance challenges as pt independent and wanting to return to work at d/c. Will continue to follow.   Follow Up Recommendations  No PT follow up     Equipment Recommendations  None recommended by PT    Recommendations for Other Services       Precautions / Restrictions Precautions Precautions: None Restrictions Weight Bearing Restrictions: No    Mobility  Bed Mobility               General bed mobility comments: Pt up in chair.  Transfers Overall transfer level: Needs assistance Equipment used: None Transfers: Sit to/from Stand Sit to Stand: Supervision         General transfer comment: Supervision for safety. Reaching for IV pole for support.   Ambulation/Gait Ambulation/Gait assistance: Supervision Ambulation Distance (Feet): 200 Feet Assistive device:  (pushing IV pole) Gait Pattern/deviations: Step-through pattern;Decreased stride length;Wide base of support Gait velocity: speed improved   General Gait Details: Steady when using IV pole for support. No LOB. DOE noted.    Stairs            Wheelchair Mobility    Modified Rankin (Stroke Patients Only)       Balance Overall balance assessment: Needs assistance Sitting-balance support: Feet supported;No upper extremity supported Sitting balance-Leahy Scale: Normal     Standing balance support: During functional activity Standing balance-Leahy Scale: Good                      Cognition Arousal/Alertness:  Awake/alert Behavior During Therapy: WFL for tasks assessed/performed Overall Cognitive Status: Within Functional Limits for tasks assessed                      Exercises      General Comments General comments (skin integrity, edema, etc.): Discussed importance of ambulation 3 times per day to improve strength/mobility/endurance.       Pertinent Vitals/Pain Pain Assessment: Faces Faces Pain Scale: Hurts Annette little bit Pain Location: lower abdomen Pain Descriptors / Indicators: Guarding Pain Intervention(s): Monitored during session;Repositioned    Home Living                      Prior Function            PT Goals (current goals can now be found in the care plan section) Progress towards PT goals: Progressing toward goals    Frequency  Min 3X/week    PT Plan Current plan remains appropriate    Co-evaluation             End of Session   Activity Tolerance: Patient tolerated treatment well Patient left: in chair;with call bell/phone within reach     Time: 0936-0950 PT Time Calculation (min) (ACUTE ONLY): 14 min  Charges:  $Therapeutic Exercise: 8-22 mins                    G Codes:      Annette Walker Annette Walker 09/21/2015, 10:04 AM Annette Walker, PT,  DPT [phone]

## 2015-09-21 NOTE — Discharge Summary (Addendum)
Physician Discharge Summary  Annette Walker:[id number] DOB: 01-Nov-1950 DOA: 09/15/2015  PCP: Philis Fendt, MD  Admit date: 09/15/2015 Discharge date: 09/21/2015  Recommendations for Outpatient Follow-up:  1. Follow up with Dr. Donella Stade, General Surgery, in 2 weeks or sooner if needed 2. Outpatient wound vac with changes MWF 3. [place] RN to assist with wound vac 4. Augmentin to complete a 14-day course 5. PCP in 2 weeks for blood pressure check.  Screening for HIV and diabetes if not already completed.  Repeat CBC to follow up leukocytosis and anemia.    Discharge Diagnoses:  Principal Problem:   Necrotizing fasciitis ([place]) Active Problems:   Open abdominal wall wound   Abdominal pain, acute   Acute hypoxemic respiratory failure (HCC)   Respiratory failure (HCC)   Lactic acidosis   AKI (acute kidney injury) ([place])   Acute respiratory failure (HCC)   Microcytic anemia   Discharge Condition: stable, improved  Diet recommendation: healthy heart  Wt Readings from Last 3 Encounters:  09/17/15 149.3 kg (329 lb 2.4 oz)    History of present illness:   65 y.o. F admitted 05/03 with nec fasc of right lower pelvis requiring emergent I&D. Returned to the ICU on the vent post operatively. Extubated 5/3 .  Hospital Course:   Sepsis secondary to necrotizing fasciitis of right lower pelvis s/p emergent I&D 09/16/15 with leukocytosis, tachycardia, lactic acidosis, acute renal failure at admission.  Sepsis physiology resolved with debridement and antibiotics and IV fluids.  Lactic acid trended down from 6.  She was initially placed on vancomycin, zosyn, and clindamycin, however, wound culture grew mixed flora without Staph or S. Pyogenes (GAS).  She was transitioned to zosyn and clindamycin which she continued until discharge.  She was given a prescription for Augmentin to continue at home to complete a 14-day course of antibiotics.  She was advised of the risk of c. Diff diarrhea and given a  prescription for florastor.  She should seek medical attention if she develops diarrhea anytime in the next three months.  To aid wound healing, she had a wound vac placed and it will be continued until she follows up with general surgery in 10-days.    Sinus tachycardia with PACs, likely secondary to sepsis and having her BB held. TSH was 1.436.  Her beta blocker was resumed after she recovered from sepsis and her HR trended down with fewer PACs.    HTN, blood pressure low normal.  Metoprolol dose resumed at 66m.  OTher BP medications held.    Hypokalemia, given oral repletion.  Mg normal   AKI, creatinine 4.5 and trended down to her baseline of 1.2 with IVF  Acute metabolic encephalopathy due to toxic met enceph due to fasciitis - resolved.  Morbid obesity - Body mass index is 53.15 kg/(m^2).  Microcytic anemia, likely partly due to acute illness/sepsis, but may have some iron deficiency.  Due to inflammation from sepsis, will not check anemia panel now and defer to PCP as outpatient if not already completed.    Consultants:   General surgery  PCCM  Antibiotics: Clindamycin 5/2 > Zosyn 5/2 > Vanc 5/2 > 5/5  Procedures Debridement of right lower pelvis on 09/16/2015  Discharge Exam: Filed Vitals:   09/21/15 0136 09/21/15 0609  BP: 137/68 129/56  Pulse: 60 70  Temp: 98.7 F (37.1 C) 98.3 F (36.8 C)  Resp: 16 16   Filed Vitals:   09/20/15 1730 09/20/15 2256 09/21/15 0136 09/21/15 0609  BP: 114/62  128/67 137/68 129/56  Pulse: 71 77 60 70  Temp: 98.6 F (37 C) 98.2 F (36.8 C) 98.7 F (37.1 C) 98.3 F (36.8 C)  TempSrc: Oral Oral Oral Oral  Resp: _0 Height:      Weight:      SpO2: 100% 96% 93% 100%   General exam: Adult female, no acute distress,  Respiratory system: Clear to auscultation. Respiratory effort normal. Cardiovascular system: S1 & S2 heard, RRR. No JVD, murmurs, rubs, gallops or clicks. Warm extremities Gastrointestinal system:  Abdomen is nondistended, soft and nontender. Normal bowel sounds heard.  Skin: Wound vac in place over suprapubic area with no surrounding induration or erythema. Good seal on wound vac.  MSK: Normal tone and bulk, no lower extremity edema Neuro: Grossly intact Psychiatry: Judgement and insight appear normal.  Discharge Instructions      Discharge Instructions    Call MD for:  difficulty breathing, headache or visual disturbances    Complete by:  As directed      Call MD for:  extreme fatigue    Complete by:  As directed      Call MD for:  hives    Complete by:  As directed      Call MD for:  persistant dizziness or light-headedness    Complete by:  As directed      Call MD for:  persistant nausea and vomiting    Complete by:  As directed      Call MD for:  redness, tenderness, or signs of infection (pain, swelling, redness, odor or green/yellow discharge around incision site)    Complete by:  As directed      Call MD for:  severe uncontrolled pain    Complete by:  As directed      Call MD for:  temperature >100.4    Complete by:  As directed      Diet - low sodium heart healthy    Complete by:  As directed      Discharge instructions    Complete by:  As directed   I have stopped some of your blood pressure medications and reduced the dose of your metoprolol due to low blood pressures from your infection.  Do not throw away your old blood pressure medications, just set them aside because your doctor will likely want to resume these medications in a few weeks when your blood pressure recovers.  If you develop diarrhea in the next three months, please seek medical assistance.  Return to the hospital right away if you notice any signs of worsening infection around your wound, fevers, chills, vomiting, muscle aches.     Increase activity slowly    Complete by:  As directed             Medication List    STOP taking these medications        olmesartan-hydrochlorothiazide  40-25 MG tablet  Commonly known as:  BENICAR HCT      TAKE these medications        amoxicillin-clavulanate 875-125 MG tablet  Commonly known as:  AUGMENTIN  Take 1 tablet by mouth every 12 (twelve) hours.     aspirin 81 MG tablet  Take 81 mg by mouth daily.     HYDROcodone-acetaminophen 5-325 MG tablet  Commonly known as:  NORCO/VICODIN  Take 1 tablet by mouth every 6 (six) hours as needed for moderate pain or severe pain.     metoprolol succinate 25 MG  24 hr tablet  Commonly known as:  TOPROL-XL  Take 1 tablet (25 mg total) by mouth daily.     saccharomyces boulardii 250 MG capsule  Commonly known as:  FLORASTOR  Take 1 capsule (250 mg total) by mouth 2 (two) times daily.       Follow-up Information    Follow up with Philis Fendt, MD. Schedule an appointment as soon as possible for a visit in 2 weeks.   Specialty:  Internal Medicine   Contact information:   [place] [address] [phone]       Follow up with CORNETT,THOMAS A., MD. Schedule an appointment as soon as possible for a visit in 10 days.   Specialty:  General Surgery   Contact information:   [address] [place] [address] [phone]        The results of significant diagnostics from this hospitalization (including imaging, microbiology, ancillary and laboratory) are listed below for reference.    Significant Diagnostic Studies: Ct Abdomen Pelvis Wo Contrast  09/15/2015  CLINICAL DATA:  Acute abdominal pain. EXAM: CT ABDOMEN AND PELVIS WITHOUT CONTRAST TECHNIQUE: Multidetector CT imaging of the abdomen and pelvis was performed following the standard protocol without IV contrast. COMPARISON:  None. FINDINGS: The lung bases are clear. Diffuse fatty infiltration of the liver. The unenhanced appearance of the gallbladder, pancreas, spleen, adrenal glands, kidneys, abdominal aorta, inferior vena cava, and retroperitoneal lymph nodes is unremarkable. The stomach, small  bowel, and colon are decompressed. Small periumbilical hernia containing fat. No free air or free fluid in the abdomen. Pelvis: The appendix is normal. Uterus and ovaries are not enlarged. Intrauterine device is present. 2 cm lesion in the left ovary contains macroscopic fat likely representing a dermoid cyst. No pelvic mass or lymphadenopathy. There is infiltration throughout the subcutaneous fat of the low pelvis, extending over the right hip and groin region. There is prominent soft tissue gas. No discrete abscess. Appearance is consistent with infection with gas producing organism. Prominent lymph nodes in the right groin region are likely reactive. Degenerative changes in the spine. No destructive bone lesions. IMPRESSION: Infiltration and gas in the soft tissues of the low right pelvis extending over the right hip and groin region. Changes are consistent with necrotizing fasciitis due to gas-forming organism. No discrete abscess. Diffuse fatty infiltration of the liver. Small dermoid cyst in the left ovary. These results were called by telephone at the time of interpretation on 09/15/2015 at 10:18 pm to Dr. Leata Mouse , who verbally acknowledged these results. Electronically Signed   By: Lucienne Capers M.D.   On: 09/15/2015 22:20   Dg Chest Port 1 View  09/16/2015  CLINICAL DATA:  Respiratory failure EXAM: PORTABLE CHEST 1 VIEW COMPARISON:  02/02/2010 FINDINGS: Endotracheal tube is 2 cm above the carina. Patchy opacity in the central lungs and upper lobes may represent pneumonia. Pulmonary hemorrhage could also have this appearance. No large effusions. No pneumothorax. IMPRESSION: Satisfactory ET tube position. Patchy central and upper lobe opacities bilaterally, possibly pneumonia. Electronically Signed   By: Andreas Newport M.D.   On: 09/16/2015 02:37    Microbiology: Recent Results (from the past 240 hour(s))  Culture, blood (Routine x 2)     Status: None   Collection Time: 09/15/15  7:27 PM   Result Value Ref Range Status   Specimen Description BLOOD RIGHT ARM  Final   Special Requests   Final    BOTTLES DRAWN AEROBIC AND ANAEROBIC  BLUE 10CC RED 5CC   Culture NO GROWTH 5 DAYS  Final   Report Status 09/20/2015 FINAL  Final  Culture, blood (Routine x 2)     Status: None   Collection Time: 09/15/15  7:35 PM  Result Value Ref Range Status   Specimen Description BLOOD LEFT ARM  Final   Special Requests IN PEDIATRIC BOTTLE 4CC  Final   Culture NO GROWTH 5 DAYS  Final   Report Status 09/20/2015 FINAL  Final  Anaerobic culture     Status: None (Preliminary result)   Collection Time: 09/16/15 12:57 AM  Result Value Ref Range Status   Specimen Description ABSCESS  Final   Special Requests ABDOMINAL WALL  Final   Gram Stain   Final    NO WBC SEEN NO SQUAMOUS EPITHELIAL CELLS SEEN FEW GRAM POSITIVE RODS Performed at [facility]    Culture   Final    NO ANAEROBES ISOLATED; CULTURE IN PROGRESS FOR 5 DAYS Performed at [facility]    Report Status PENDING  Incomplete  Culture, routine-abscess     Status: None   Collection Time: 09/16/15 12:57 AM  Result Value Ref Range Status   Specimen Description ABSCESS  Final   Special Requests ABDOMINAL WALL  Final   Gram Stain   Final    NO WBC SEEN NO SQUAMOUS EPITHELIAL CELLS SEEN FEW GRAM POSITIVE RODS Performed at [facility]    Culture   Final    MULTIPLE ORGANISMS PRESENT, NONE PREDOMINANT Note: NO STAPHYLOCOCCUS AUREUS ISOLATED NO GROUP A STREP (S.PYOGENES) ISOLATED Performed at [facility]    Report Status 09/20/2015 FINAL  Final  MRSA PCR Screening     Status: None   Collection Time: 09/16/15  2:00 AM  Result Value Ref Range Status   MRSA by PCR NEGATIVE NEGATIVE Final    Comment:        The GeneXpert MRSA Assay (FDA approved for NASAL specimens only), is one component of a comprehensive MRSA colonization surveillance program. It is not intended to diagnose MRSA infection nor to  guide or monitor treatment for MRSA infections.   Urine culture     Status: None   Collection Time: 09/16/15  2:35 AM  Result Value Ref Range Status   Specimen Description URINE, CATHETERIZED  Final   Special Requests NONE  Final   Culture NO GROWTH 1 DAY  Final   Report Status 09/17/2015 FINAL  Final     Labs: Basic Metabolic Panel:  Recent Labs Lab 09/16/15 0736 09/17/15 0228 09/18/15 0330 09/19/15 0309 09/20/15 0233  NA 142 142 145 140 142  K 3.3* 3.1* 3.2* 3.4* 3.4*  CL 110 108 107 108 109  CO2 20* _0 GLUCOSE 110* 102* 92 125* 103*  BUN 42* 28* _1 CREATININE 3.07* 2.03* 1.57* 1.16* 1.22*  CALCIUM 7.6* 7.7* 8.0* 7.7* 8.0*  MG 1.9  --   --   --   --   PHOS 4.2  --   --   --   --    Liver Function Tests:  Recent Labs Lab 09/15/15 1929 09/16/15 0736 09/18/15 0330 09/20/15 0233  AST 43* 37 37 31  ALT _2 ALKPHOS 73 57 56 51  BILITOT 1.7* 1.9* 1.6* 0.7  PROT 8.0 6.5 5.9* 6.1*  ALBUMIN 2.5* 1.9* 1.7* 1.6*   No results for input(s): LIPASE, AMYLASE in the last 168 hours. No results for input(s):  AMMONIA in the last 168 hours. CBC:  Recent Labs Lab 09/15/15 1929 09/16/15 0736 09/17/15 0228 09/18/15 0330 09/19/15 0309 09/20/15 0233  WBC 24.3* 16.4* 13.4* 11.9* 12.3* 13.1*  NEUTROABS 21.0*  --   --   --   --   --   HGB 12.1 10.3* 9.3* 9.2* 8.9* 9.0*  HCT 35.4* 29.8* 27.1* 27.9* 26.9* 27.5*  MCV 80.3 78.2 77.4* 79.7 80.1 79.9  PLT 254 204 202 268 258 260   Cardiac Enzymes: No results for input(s): CKTOTAL, CKMB, CKMBINDEX, TROPONINI in the last 168 hours. BNP: BNP (last 3 results) No results for input(s): BNP in the last 8760 hours.  ProBNP (last 3 results) No results for input(s): PROBNP in the last 8760 hours.  CBG:  Recent Labs Lab 09/16/15 0223 09/16/15 0407  GLUCAP 98 114*    Time coordinating discharge: 35 minutes  Signed:  Doreatha Offer  [facility] 09/21/2015, 2:04 PM

## 2015-09-21 NOTE — Care Management Note (Signed)
Case Management Note  Patient Details  Name: Annette Walker MRN: [id number] Date of Birth: 1950/11/25  Subjective/Objective:                    Action/Plan: Plan is for patient to discharge home today with [place] services. CM met with the patient and she already has vac for home in her room with the needed equipment. WOC RN changed dressing today and will switch to the home VAC later today prior to discharge. CM will updated [facility] about plan to d/c home today.   Expected Discharge Date:                  Expected Discharge Plan:  [place]  In-House Referral:     Discharge planning Services  CM Consult  Post Acute Care Choice:  Home Health Choice offered to:  Patient  DME Arranged:  Vac DME Agency:  [place] Arranged:  RN [facility] Agency:  [place]  Status of Service:  Completed, signed off  Medicare Important Message Given:    Date Medicare IM Given:    Medicare IM give by:    Date Additional Medicare IM Given:    Additional Medicare Important Message give by:     If discussed at [place] of Stay Meetings, dates discussed:    Additional Comments:  Pollie Friar, RN 09/21/2015, 10:57 AM

## 2015-09-21 NOTE — Progress Notes (Signed)
Patient ID: FAUN MCQUEEN, female   DOB: September 11, 1950, 66 y.o.   MRN: [id number]     [facility]      [address] [place], [place] [address]    Phone: [phone] FAX: [phone]     Subjective: woc at bedside. Afebrile.  Objective:  Vital signs:  Filed Vitals:   09/20/15 1730 09/20/15 2256 09/21/15 0136 09/21/15 0609  BP: 114/62 128/67 137/68 129/56  Pulse: 71 77 60 70  Temp: 98.6 F (37 C) 98.2 F (36.8 C) 98.7 F (37.1 C) 98.3 F (36.8 C)  TempSrc: Oral Oral Oral Oral  Resp: 18 16 16 16   Height:      Weight:      SpO2: 100% 96% 93% 100%    Last BM Date: 09/19/15  Intake/Output   Yesterday:  05/07 0701 - 05/08 0700 In: 480 [P.O.:480] Out: -  This shift:    I/O last 3 completed shifts: In: 480 [P.O.:480] Out: -     Physical Exam: General: Pt awake/alert/oriented x4 in no acute distress Abdomen: Soft.  Nondistended.  Tender over wound.  Wound is granulating well, clean.  VAC replaced.  No evidence of peritonitis.  No incarcerated hernias.    Problem List:   Active Problems:   Open abdominal wall wound   Necrotizing fasciitis (HCC)   Abdominal pain, acute   Acute hypoxemic respiratory failure (HCC)   Respiratory failure (HCC)   Lactic acidosis   AKI (acute kidney injury) ([place])   Acute respiratory failure ([place])    Results:   Labs: Results for orders placed or performed during the hospital encounter of 09/15/15 (from the past 48 hour(s))  CBC     Status: Abnormal   Collection Time: 09/20/15  2:33 AM  Result Value Ref Range   WBC 13.1 (H) 4.0 - 10.5 K/uL   RBC 3.44 (L) 3.87 - 5.11 MIL/uL   Hemoglobin 9.0 (L) 12.0 - 15.0 g/dL   HCT 27.5 (L) 36.0 - 46.0 %   MCV 79.9 78.0 - 100.0 fL   MCH 26.2 26.0 - 34.0 pg   MCHC 32.7 30.0 - 36.0 g/dL   RDW 14.7 11.5 - 15.5 %   Platelets 260 150 - 400 K/uL  Comprehensive metabolic panel     Status: Abnormal   Collection Time: 09/20/15  2:33 AM  Result Value  Ref Range   Sodium 142 135 - 145 mmol/L   Potassium 3.4 (L) 3.5 - 5.1 mmol/L   Chloride 109 101 - 111 mmol/L   CO2 25 22 - 32 mmol/L   Glucose, Bld 103 (H) 65 - 99 mg/dL   BUN 6 6 - 20 mg/dL   Creatinine, Ser 1.22 (H) 0.44 - 1.00 mg/dL   Calcium 8.0 (L) 8.9 - 10.3 mg/dL   Total Protein 6.1 (L) 6.5 - 8.1 g/dL   Albumin 1.6 (L) 3.5 - 5.0 g/dL   AST 31 15 - 41 U/L   ALT 22 14 - 54 U/L   Alkaline Phosphatase 51 38 - 126 U/L   Total Bilirubin 0.7 0.3 - 1.2 mg/dL   GFR calc non Af Amer 46 (L) >60 mL/min   GFR calc Af Amer 54 (L) >60 mL/min    Comment: (NOTE) The eGFR has been calculated using the CKD EPI equation. This calculation has not been validated in all clinical situations. eGFR's persistently <60 mL/min signify possible Chronic Kidney Disease.    Anion gap 8 5 - 15  Imaging / Studies: No results found.  Medications / Allergies:  Scheduled Meds: . aspirin  81 mg Oral Daily  . clindamycin (CLEOCIN) IV  900 mg Intravenous Q8H  . docusate sodium  100 mg Oral BID  . enoxaparin (LOVENOX) injection  70 mg Subcutaneous Q24H  . metoprolol succinate  25 mg Oral Daily  . piperacillin-tazobactam (ZOSYN)  IV  3.375 g Intravenous Q8H  . saccharomyces boulardii  250 mg Oral BID   Continuous Infusions:  PRN Meds:.fentaNYL (SUBLIMAZE) injection, HYDROcodone-acetaminophen, ondansetron **OR** ondansetron (ZOFRAN) IV, polyethylene glycol  Antibiotics: Anti-infectives    Start     Dose/Rate Route Frequency Ordered Stop   09/17/15 1715  vancomycin (VANCOCIN) 1,750 mg in sodium chloride 0.9 % 500 mL IVPB  Status:  Discontinued     1,750 mg 250 mL/hr over 120 Minutes Intravenous Every 24 hours 09/17/15 1714 09/18/15 1445   09/16/15 1600  piperacillin-tazobactam (ZOSYN) IVPB 3.375 g     3.375 g 12.5 mL/hr over 240 Minutes Intravenous Every 8 hours 09/16/15 1513     09/16/15 0400  clindamycin (CLEOCIN) IVPB 900 mg     900 mg 100 mL/hr over 30 Minutes Intravenous Every 8 hours 09/16/15  0249     09/16/15 0215  piperacillin-tazobactam (ZOSYN) IVPB 3.375 g  Status:  Discontinued     3.375 g 12.5 mL/hr over 240 Minutes Intravenous Every 8 hours 09/16/15 0207 09/16/15 0210   09/16/15 0042  piperacillin-tazobactam (ZOSYN) IVPB 2.25 g  Status:  Discontinued     2.25 g 100 mL/hr over 30 Minutes Intravenous Every 8 hours 09/15/15 2048 09/16/15 1512   09/15/15 2130  clindamycin (CLEOCIN) IVPB 600 mg     600 mg 100 mL/hr over 30 Minutes Intravenous  Once 09/15/15 2120 09/16/15 0041   09/15/15 2030  piperacillin-tazobactam (ZOSYN) IVPB 3.375 g     3.375 g 100 mL/hr over 30 Minutes Intravenous STAT 09/15/15 2026 09/15/15 2236   09/15/15 2030  vancomycin (VANCOCIN) 2,500 mg in sodium chloride 0.9 % 500 mL IVPB     2,500 mg 250 mL/hr over 120 Minutes Intravenous  Once 09/15/15 2026 09/15/15 2310        Assessment/Plan S/p I&D abdominal wall nec fasciitis--Dr. Cornett -tolerated VAC change with oral pain meds.  Can discharge from a surgical standpoint with VAC changes M-W-F taking pain meds about 1 hour before.   Follow up with Dr. Brantley Stage.  Can change to PO antibiotics for a 10-14 days course   Erby Pian, [facility] Pager [phone]) For consults and floor pages call [phone](7A-4:30P)  09/21/2015 10:55 AM

## 2015-09-21 NOTE — Consult Note (Signed)
   [facility]   09/21/2015  Annette Walker 1951-04-14 [id number]   Bedside visit with [name] on behalf of [facility]/ Link to Wellness program for [facility] employees/ dependents with [facility] [employer] insurance. Discussed that she will receive post hospital discharge call. Confirmed best contact number as [phone] cell and home number as [phone]. Denies having issues with affording medications or with transportation to appointments. Endorses she lives with her husband and father. Confirms her Primary Care MD is Dr. Concepcion ElkAvbuere. Brochure and contact information left at bedside. She endorses she will have home health RN with [facility] post discharge. She will go home with a wound vac. Appreciative of visit.   Raiford NobleAtika Cobi Aldape, MSN-Ed, RN,BSN [facility] Care Management Hospital Liaison [phone]

## 2015-09-21 NOTE — Consult Note (Addendum)
WOC wound consult note Reason for Consult: Vac change to lower abd pannus fold with Emina, PA at bedside. Pt is followed by surgical team for assessment and plan of care. Site will be very difficult to maintain seal related to multiple folds, moist skin, and located over perineum/hair areas Wound type: Full thickness post-op wound Wound bed: Beefy red, unchanged since previus assessment Drainage (amount, consistency, odor) Large amt tan drainage in the cannister, no odor Periwound: Intact skin surrounding  Dressing procedure/placement/frequency: Applied barrier rings to maintain seal to lower wound edges, then packed with 1 piece black sponge and cont suction applied at 125mm. Pt medicated for pain with po meds prior to the procedure and tolerated with mod amt discomfort. Plan dressing change Wed.  Cammie Mcgeeawn Shyne Resch MSN, RN, CWOCN, [place], CNS [phone]

## 2015-09-21 NOTE — Progress Notes (Signed)
Pt discharged to home, via wheelchair. Accompanied by nurse and husband and daughter. All discharge instructions given to pt, she stated she understood them.

## 2015-09-22 ENCOUNTER — Other Ambulatory Visit: Payer: Self-pay | Admitting: *Deleted

## 2015-09-22 DIAGNOSIS — A419 Sepsis, unspecified organism: Secondary | ICD-10-CM | POA: Diagnosis not present

## 2015-09-22 DIAGNOSIS — I10 Essential (primary) hypertension: Secondary | ICD-10-CM | POA: Diagnosis not present

## 2015-09-22 DIAGNOSIS — M726 Necrotizing fasciitis: Secondary | ICD-10-CM | POA: Diagnosis not present

## 2015-09-22 NOTE — Patient Outreach (Signed)
[facility]) Care Management  09/22/2015  Annette Walker 1950-08-09 [id number]   Transition of care (week 1)  RN attempted to reach pt today via recent hospital discharge however unsuccessful and RN unable to leave contact information for possible call back. Will continue outreach calls [facility] if needed.   Elliot CousinLisa Matthews, RN Care Management Coordinator [facility] Main Office [phone]

## 2015-09-23 ENCOUNTER — Other Ambulatory Visit: Payer: Self-pay | Admitting: *Deleted

## 2015-09-23 DIAGNOSIS — A419 Sepsis, unspecified organism: Secondary | ICD-10-CM | POA: Diagnosis not present

## 2015-09-23 DIAGNOSIS — M726 Necrotizing fasciitis: Secondary | ICD-10-CM | POA: Diagnosis not present

## 2015-09-23 DIAGNOSIS — I10 Essential (primary) hypertension: Secondary | ICD-10-CM | POA: Diagnosis not present

## 2015-09-23 NOTE — Patient Outreach (Signed)
[facility]) Care Management  09/23/2015  Annette Walker [birth year] [id number]   Transition of care  RN attempted to reach pt however remains unsuccessful. Will continue another attempt if unsuccessful will send outreach letter accordingly.  Elliot CousinLisa Matthews, RN Care Management Coordinator [facility] Main Office [phone]

## 2015-09-25 ENCOUNTER — Other Ambulatory Visit: Payer: Self-pay | Admitting: *Deleted

## 2015-09-25 DIAGNOSIS — M726 Necrotizing fasciitis: Secondary | ICD-10-CM | POA: Diagnosis not present

## 2015-09-25 DIAGNOSIS — I10 Essential (primary) hypertension: Secondary | ICD-10-CM | POA: Diagnosis not present

## 2015-09-25 DIAGNOSIS — A419 Sepsis, unspecified organism: Secondary | ICD-10-CM | POA: Diagnosis not present

## 2015-09-26 DIAGNOSIS — A419 Sepsis, unspecified organism: Secondary | ICD-10-CM | POA: Diagnosis not present

## 2015-09-26 DIAGNOSIS — M726 Necrotizing fasciitis: Secondary | ICD-10-CM | POA: Diagnosis not present

## 2015-09-26 DIAGNOSIS — I10 Essential (primary) hypertension: Secondary | ICD-10-CM | POA: Diagnosis not present

## 2015-09-28 ENCOUNTER — Other Ambulatory Visit: Payer: Self-pay | Admitting: *Deleted

## 2015-09-28 ENCOUNTER — Encounter: Payer: Self-pay | Admitting: *Deleted

## 2015-09-28 DIAGNOSIS — I10 Essential (primary) hypertension: Secondary | ICD-10-CM | POA: Diagnosis not present

## 2015-09-28 DIAGNOSIS — T8189XA Other complications of procedures, not elsewhere classified, initial encounter: Secondary | ICD-10-CM | POA: Diagnosis not present

## 2015-09-28 DIAGNOSIS — A419 Sepsis, unspecified organism: Secondary | ICD-10-CM | POA: Diagnosis not present

## 2015-09-28 DIAGNOSIS — M726 Necrotizing fasciitis: Secondary | ICD-10-CM | POA: Diagnosis not present

## 2015-09-28 NOTE — Patient Outreach (Signed)
[facility]) Care Management  09/25/2015  Kirstie Perilla L Lease 1951/01/04 [id number]   Third outreach attempt however remains unsuccessful. Will send outreach letter and allow [facility].   Elliot CousinLisa Jacquetta Polhamus, RN Care Management Coordinator [facility] Main Office [phone]

## 2015-09-29 DIAGNOSIS — A419 Sepsis, unspecified organism: Secondary | ICD-10-CM | POA: Diagnosis not present

## 2015-09-29 DIAGNOSIS — M726 Necrotizing fasciitis: Secondary | ICD-10-CM | POA: Diagnosis not present

## 2015-09-29 DIAGNOSIS — I10 Essential (primary) hypertension: Secondary | ICD-10-CM | POA: Diagnosis not present

## 2015-09-30 DIAGNOSIS — S31109A Unspecified open wound of abdominal wall, unspecified quadrant without penetration into peritoneal cavity, initial encounter: Secondary | ICD-10-CM | POA: Diagnosis not present

## 2015-10-01 DIAGNOSIS — I10 Essential (primary) hypertension: Secondary | ICD-10-CM | POA: Diagnosis not present

## 2015-10-01 DIAGNOSIS — M726 Necrotizing fasciitis: Secondary | ICD-10-CM | POA: Diagnosis not present

## 2015-10-01 DIAGNOSIS — A419 Sepsis, unspecified organism: Secondary | ICD-10-CM | POA: Diagnosis not present

## 2015-10-02 DIAGNOSIS — A419 Sepsis, unspecified organism: Secondary | ICD-10-CM | POA: Diagnosis not present

## 2015-10-02 DIAGNOSIS — I10 Essential (primary) hypertension: Secondary | ICD-10-CM | POA: Diagnosis not present

## 2015-10-02 DIAGNOSIS — M726 Necrotizing fasciitis: Secondary | ICD-10-CM | POA: Diagnosis not present

## 2015-10-03 DIAGNOSIS — M726 Necrotizing fasciitis: Secondary | ICD-10-CM | POA: Diagnosis not present

## 2015-10-03 DIAGNOSIS — I10 Essential (primary) hypertension: Secondary | ICD-10-CM | POA: Diagnosis not present

## 2015-10-03 DIAGNOSIS — A419 Sepsis, unspecified organism: Secondary | ICD-10-CM | POA: Diagnosis not present

## 2015-10-05 DIAGNOSIS — S31109A Unspecified open wound of abdominal wall, unspecified quadrant without penetration into peritoneal cavity, initial encounter: Secondary | ICD-10-CM | POA: Diagnosis not present

## 2015-10-06 DIAGNOSIS — M726 Necrotizing fasciitis: Secondary | ICD-10-CM | POA: Diagnosis not present

## 2015-10-06 DIAGNOSIS — A419 Sepsis, unspecified organism: Secondary | ICD-10-CM | POA: Diagnosis not present

## 2015-10-06 DIAGNOSIS — T8189XA Other complications of procedures, not elsewhere classified, initial encounter: Secondary | ICD-10-CM | POA: Diagnosis not present

## 2015-10-06 DIAGNOSIS — I10 Essential (primary) hypertension: Secondary | ICD-10-CM | POA: Diagnosis not present

## 2015-10-08 DIAGNOSIS — M726 Necrotizing fasciitis: Secondary | ICD-10-CM | POA: Diagnosis not present

## 2015-10-08 DIAGNOSIS — A419 Sepsis, unspecified organism: Secondary | ICD-10-CM | POA: Diagnosis not present

## 2015-10-08 DIAGNOSIS — I10 Essential (primary) hypertension: Secondary | ICD-10-CM | POA: Diagnosis not present

## 2015-10-11 DIAGNOSIS — A419 Sepsis, unspecified organism: Secondary | ICD-10-CM | POA: Diagnosis not present

## 2015-10-11 DIAGNOSIS — I10 Essential (primary) hypertension: Secondary | ICD-10-CM | POA: Diagnosis not present

## 2015-10-11 DIAGNOSIS — M726 Necrotizing fasciitis: Secondary | ICD-10-CM | POA: Diagnosis not present

## 2015-10-13 ENCOUNTER — Encounter: Payer: Self-pay | Admitting: *Deleted

## 2015-10-14 DIAGNOSIS — I10 Essential (primary) hypertension: Secondary | ICD-10-CM | POA: Diagnosis not present

## 2015-10-14 DIAGNOSIS — M726 Necrotizing fasciitis: Secondary | ICD-10-CM | POA: Diagnosis not present

## 2015-10-14 DIAGNOSIS — A419 Sepsis, unspecified organism: Secondary | ICD-10-CM | POA: Diagnosis not present

## 2015-10-15 DIAGNOSIS — N182 Chronic kidney disease, stage 2 (mild): Secondary | ICD-10-CM | POA: Diagnosis not present

## 2015-10-15 DIAGNOSIS — I10 Essential (primary) hypertension: Secondary | ICD-10-CM | POA: Diagnosis not present

## 2015-10-15 DIAGNOSIS — S31102A Unspecified open wound of abdominal wall, epigastric region without penetration into peritoneal cavity, initial encounter: Secondary | ICD-10-CM | POA: Diagnosis not present

## 2015-10-16 DIAGNOSIS — I10 Essential (primary) hypertension: Secondary | ICD-10-CM | POA: Diagnosis not present

## 2015-10-16 DIAGNOSIS — S31109A Unspecified open wound of abdominal wall, unspecified quadrant without penetration into peritoneal cavity, initial encounter: Secondary | ICD-10-CM | POA: Diagnosis not present

## 2015-10-16 DIAGNOSIS — A419 Sepsis, unspecified organism: Secondary | ICD-10-CM | POA: Diagnosis not present

## 2015-10-16 DIAGNOSIS — M726 Necrotizing fasciitis: Secondary | ICD-10-CM | POA: Diagnosis not present

## 2015-10-19 DIAGNOSIS — S31102A Unspecified open wound of abdominal wall, epigastric region without penetration into peritoneal cavity, initial encounter: Secondary | ICD-10-CM | POA: Diagnosis not present

## 2015-10-21 DIAGNOSIS — A419 Sepsis, unspecified organism: Secondary | ICD-10-CM | POA: Diagnosis not present

## 2015-10-21 DIAGNOSIS — M726 Necrotizing fasciitis: Secondary | ICD-10-CM | POA: Diagnosis not present

## 2015-10-21 DIAGNOSIS — I10 Essential (primary) hypertension: Secondary | ICD-10-CM | POA: Diagnosis not present

## 2015-10-22 DIAGNOSIS — M726 Necrotizing fasciitis: Secondary | ICD-10-CM | POA: Diagnosis not present

## 2015-10-22 DIAGNOSIS — A419 Sepsis, unspecified organism: Secondary | ICD-10-CM | POA: Diagnosis not present

## 2015-10-22 DIAGNOSIS — I10 Essential (primary) hypertension: Secondary | ICD-10-CM | POA: Diagnosis not present

## 2015-10-26 DIAGNOSIS — I10 Essential (primary) hypertension: Secondary | ICD-10-CM | POA: Diagnosis not present

## 2015-10-26 DIAGNOSIS — M726 Necrotizing fasciitis: Secondary | ICD-10-CM | POA: Diagnosis not present

## 2015-10-26 DIAGNOSIS — A419 Sepsis, unspecified organism: Secondary | ICD-10-CM | POA: Diagnosis not present

## 2015-11-02 DIAGNOSIS — I10 Essential (primary) hypertension: Secondary | ICD-10-CM | POA: Diagnosis not present

## 2015-11-02 DIAGNOSIS — A419 Sepsis, unspecified organism: Secondary | ICD-10-CM | POA: Diagnosis not present

## 2015-11-02 DIAGNOSIS — M726 Necrotizing fasciitis: Secondary | ICD-10-CM | POA: Diagnosis not present

## 2015-11-09 DIAGNOSIS — A419 Sepsis, unspecified organism: Secondary | ICD-10-CM | POA: Diagnosis not present

## 2015-11-09 DIAGNOSIS — I10 Essential (primary) hypertension: Secondary | ICD-10-CM | POA: Diagnosis not present

## 2015-11-09 DIAGNOSIS — M726 Necrotizing fasciitis: Secondary | ICD-10-CM | POA: Diagnosis not present

## 2015-11-16 DIAGNOSIS — A419 Sepsis, unspecified organism: Secondary | ICD-10-CM | POA: Diagnosis not present

## 2015-11-16 DIAGNOSIS — I10 Essential (primary) hypertension: Secondary | ICD-10-CM | POA: Diagnosis not present

## 2015-11-16 DIAGNOSIS — M726 Necrotizing fasciitis: Secondary | ICD-10-CM | POA: Diagnosis not present

## 2015-11-18 DIAGNOSIS — K219 Gastro-esophageal reflux disease without esophagitis: Secondary | ICD-10-CM | POA: Diagnosis not present

## 2015-11-18 DIAGNOSIS — Z124 Encounter for screening for malignant neoplasm of cervix: Secondary | ICD-10-CM | POA: Diagnosis not present

## 2015-11-18 DIAGNOSIS — E559 Vitamin D deficiency, unspecified: Secondary | ICD-10-CM | POA: Diagnosis not present

## 2015-11-18 DIAGNOSIS — S31102A Unspecified open wound of abdominal wall, epigastric region without penetration into peritoneal cavity, initial encounter: Secondary | ICD-10-CM | POA: Diagnosis not present

## 2015-11-18 DIAGNOSIS — Z1239 Encounter for other screening for malignant neoplasm of breast: Secondary | ICD-10-CM | POA: Diagnosis not present

## 2015-11-18 DIAGNOSIS — G479 Sleep disorder, unspecified: Secondary | ICD-10-CM | POA: Diagnosis not present

## 2015-11-18 DIAGNOSIS — I10 Essential (primary) hypertension: Secondary | ICD-10-CM | POA: Diagnosis not present

## 2016-02-03 DIAGNOSIS — K219 Gastro-esophageal reflux disease without esophagitis: Secondary | ICD-10-CM | POA: Diagnosis not present

## 2016-02-03 DIAGNOSIS — Z131 Encounter for screening for diabetes mellitus: Secondary | ICD-10-CM | POA: Diagnosis not present

## 2016-02-03 DIAGNOSIS — I10 Essential (primary) hypertension: Secondary | ICD-10-CM | POA: Diagnosis not present

## 2016-02-03 DIAGNOSIS — E559 Vitamin D deficiency, unspecified: Secondary | ICD-10-CM | POA: Diagnosis not present

## 2016-02-03 DIAGNOSIS — N3080 Other cystitis without hematuria: Secondary | ICD-10-CM | POA: Diagnosis not present

## 2016-02-03 DIAGNOSIS — Z1239 Encounter for other screening for malignant neoplasm of breast: Secondary | ICD-10-CM | POA: Diagnosis not present

## 2016-02-03 DIAGNOSIS — Z13220 Encounter for screening for lipoid disorders: Secondary | ICD-10-CM | POA: Diagnosis not present

## 2016-02-03 DIAGNOSIS — Z23 Encounter for immunization: Secondary | ICD-10-CM | POA: Diagnosis not present

## 2016-06-30 DIAGNOSIS — I10 Essential (primary) hypertension: Secondary | ICD-10-CM | POA: Diagnosis not present

## 2016-06-30 DIAGNOSIS — M189 Osteoarthritis of first carpometacarpal joint, unspecified: Secondary | ICD-10-CM | POA: Diagnosis not present

## 2016-06-30 DIAGNOSIS — Z1239 Encounter for other screening for malignant neoplasm of breast: Secondary | ICD-10-CM | POA: Diagnosis not present

## 2016-06-30 DIAGNOSIS — K219 Gastro-esophageal reflux disease without esophagitis: Secondary | ICD-10-CM | POA: Diagnosis not present

## 2016-06-30 DIAGNOSIS — E2839 Other primary ovarian failure: Secondary | ICD-10-CM | POA: Diagnosis not present

## 2016-06-30 DIAGNOSIS — L26 Exfoliative dermatitis: Secondary | ICD-10-CM | POA: Diagnosis not present

## 2016-07-19 ENCOUNTER — Other Ambulatory Visit: Payer: Self-pay | Admitting: Internal Medicine

## 2016-07-19 DIAGNOSIS — E2839 Other primary ovarian failure: Secondary | ICD-10-CM

## 2016-12-29 DIAGNOSIS — Z13220 Encounter for screening for lipoid disorders: Secondary | ICD-10-CM | POA: Diagnosis not present

## 2016-12-29 DIAGNOSIS — E559 Vitamin D deficiency, unspecified: Secondary | ICD-10-CM | POA: Diagnosis not present

## 2016-12-29 DIAGNOSIS — K219 Gastro-esophageal reflux disease without esophagitis: Secondary | ICD-10-CM | POA: Diagnosis not present

## 2016-12-29 DIAGNOSIS — Z131 Encounter for screening for diabetes mellitus: Secondary | ICD-10-CM | POA: Diagnosis not present

## 2016-12-29 DIAGNOSIS — Z6841 Body Mass Index (BMI) 40.0 and over, adult: Secondary | ICD-10-CM | POA: Diagnosis not present

## 2016-12-29 DIAGNOSIS — I10 Essential (primary) hypertension: Secondary | ICD-10-CM | POA: Diagnosis not present

## 2017-07-25 DIAGNOSIS — Z1239 Encounter for other screening for malignant neoplasm of breast: Secondary | ICD-10-CM | POA: Diagnosis not present

## 2017-07-25 DIAGNOSIS — Z13220 Encounter for screening for lipoid disorders: Secondary | ICD-10-CM | POA: Diagnosis not present

## 2017-07-25 DIAGNOSIS — E2839 Other primary ovarian failure: Secondary | ICD-10-CM | POA: Diagnosis not present

## 2017-07-25 DIAGNOSIS — K219 Gastro-esophageal reflux disease without esophagitis: Secondary | ICD-10-CM | POA: Diagnosis not present

## 2017-07-25 DIAGNOSIS — L26 Exfoliative dermatitis: Secondary | ICD-10-CM | POA: Diagnosis not present

## 2017-07-25 DIAGNOSIS — I10 Essential (primary) hypertension: Secondary | ICD-10-CM | POA: Diagnosis not present

## 2017-07-25 DIAGNOSIS — Z131 Encounter for screening for diabetes mellitus: Secondary | ICD-10-CM | POA: Diagnosis not present

## 2017-10-31 DIAGNOSIS — I10 Essential (primary) hypertension: Secondary | ICD-10-CM | POA: Diagnosis not present

## 2017-10-31 DIAGNOSIS — N182 Chronic kidney disease, stage 2 (mild): Secondary | ICD-10-CM | POA: Diagnosis not present

## 2017-10-31 DIAGNOSIS — E2839 Other primary ovarian failure: Secondary | ICD-10-CM | POA: Diagnosis not present

## 2017-10-31 DIAGNOSIS — Z124 Encounter for screening for malignant neoplasm of cervix: Secondary | ICD-10-CM | POA: Diagnosis not present

## 2017-10-31 DIAGNOSIS — Z1239 Encounter for other screening for malignant neoplasm of breast: Secondary | ICD-10-CM | POA: Diagnosis not present

## 2017-10-31 DIAGNOSIS — E559 Vitamin D deficiency, unspecified: Secondary | ICD-10-CM | POA: Diagnosis not present

## 2017-11-28 ENCOUNTER — Other Ambulatory Visit: Payer: Self-pay | Admitting: Internal Medicine

## 2017-11-28 DIAGNOSIS — Z1231 Encounter for screening mammogram for malignant neoplasm of breast: Secondary | ICD-10-CM

## 2017-11-28 DIAGNOSIS — E2839 Other primary ovarian failure: Secondary | ICD-10-CM

## 2018-01-12 ENCOUNTER — Other Ambulatory Visit: Payer: 59

## 2018-01-12 ENCOUNTER — Ambulatory Visit: Payer: 59

## 2018-02-22 DIAGNOSIS — Z23 Encounter for immunization: Secondary | ICD-10-CM | POA: Diagnosis not present

## 2018-02-22 DIAGNOSIS — Z6841 Body Mass Index (BMI) 40.0 and over, adult: Secondary | ICD-10-CM | POA: Diagnosis not present

## 2018-02-22 DIAGNOSIS — F430 Acute stress reaction: Secondary | ICD-10-CM | POA: Diagnosis not present

## 2018-02-22 DIAGNOSIS — N182 Chronic kidney disease, stage 2 (mild): Secondary | ICD-10-CM | POA: Diagnosis not present

## 2018-02-22 DIAGNOSIS — I10 Essential (primary) hypertension: Secondary | ICD-10-CM | POA: Diagnosis not present

## 2018-06-07 DIAGNOSIS — Z0000 Encounter for general adult medical examination without abnormal findings: Secondary | ICD-10-CM | POA: Diagnosis not present

## 2018-06-07 DIAGNOSIS — Z131 Encounter for screening for diabetes mellitus: Secondary | ICD-10-CM | POA: Diagnosis not present

## 2018-06-07 DIAGNOSIS — Z13220 Encounter for screening for lipoid disorders: Secondary | ICD-10-CM | POA: Diagnosis not present

## 2018-06-07 DIAGNOSIS — I10 Essential (primary) hypertension: Secondary | ICD-10-CM | POA: Diagnosis not present

## 2018-12-06 DIAGNOSIS — L26 Exfoliative dermatitis: Secondary | ICD-10-CM | POA: Diagnosis not present

## 2018-12-06 DIAGNOSIS — Z1239 Encounter for other screening for malignant neoplasm of breast: Secondary | ICD-10-CM | POA: Diagnosis not present

## 2018-12-06 DIAGNOSIS — N182 Chronic kidney disease, stage 2 (mild): Secondary | ICD-10-CM | POA: Diagnosis not present

## 2018-12-06 DIAGNOSIS — I10 Essential (primary) hypertension: Secondary | ICD-10-CM | POA: Diagnosis not present

## 2019-04-04 DIAGNOSIS — K219 Gastro-esophageal reflux disease without esophagitis: Secondary | ICD-10-CM | POA: Diagnosis not present

## 2019-04-04 DIAGNOSIS — I10 Essential (primary) hypertension: Secondary | ICD-10-CM | POA: Diagnosis not present

## 2019-04-04 DIAGNOSIS — F430 Acute stress reaction: Secondary | ICD-10-CM | POA: Diagnosis not present

## 2019-04-23 DIAGNOSIS — I10 Essential (primary) hypertension: Secondary | ICD-10-CM | POA: Diagnosis not present

## 2019-04-23 DIAGNOSIS — K219 Gastro-esophageal reflux disease without esophagitis: Secondary | ICD-10-CM | POA: Diagnosis not present

## 2019-04-23 DIAGNOSIS — F430 Acute stress reaction: Secondary | ICD-10-CM | POA: Diagnosis not present

## 2019-06-20 DIAGNOSIS — K219 Gastro-esophageal reflux disease without esophagitis: Secondary | ICD-10-CM | POA: Diagnosis not present

## 2019-06-20 DIAGNOSIS — N760 Acute vaginitis: Secondary | ICD-10-CM | POA: Diagnosis not present

## 2019-06-20 DIAGNOSIS — Z131 Encounter for screening for diabetes mellitus: Secondary | ICD-10-CM | POA: Diagnosis not present

## 2019-06-20 DIAGNOSIS — E559 Vitamin D deficiency, unspecified: Secondary | ICD-10-CM | POA: Diagnosis not present

## 2019-06-20 DIAGNOSIS — Z13220 Encounter for screening for lipoid disorders: Secondary | ICD-10-CM | POA: Diagnosis not present

## 2019-06-20 DIAGNOSIS — Z0000 Encounter for general adult medical examination without abnormal findings: Secondary | ICD-10-CM | POA: Diagnosis not present

## 2019-06-20 DIAGNOSIS — I10 Essential (primary) hypertension: Secondary | ICD-10-CM | POA: Diagnosis not present

## 2019-08-30 ENCOUNTER — Other Ambulatory Visit (HOSPITAL_COMMUNITY): Payer: Self-pay | Admitting: Internal Medicine

## 2019-08-30 ENCOUNTER — Other Ambulatory Visit: Payer: Self-pay

## 2019-08-30 ENCOUNTER — Emergency Department (HOSPITAL_COMMUNITY)
Admission: EM | Admit: 2019-08-30 | Discharge: 2019-08-31 | Disposition: A | Discharge status: Home / Self Care | Payer: 59 | Attending: Emergency Medicine | Admitting: Emergency Medicine

## 2019-08-30 ENCOUNTER — Emergency Department (HOSPITAL_COMMUNITY): Payer: 59

## 2019-08-30 ENCOUNTER — Encounter (HOSPITAL_COMMUNITY): Payer: Self-pay

## 2019-08-30 DIAGNOSIS — R400 Somnolence: Secondary | ICD-10-CM | POA: Diagnosis not present

## 2019-08-30 DIAGNOSIS — F1290 Cannabis use, unspecified, uncomplicated: Secondary | ICD-10-CM | POA: Diagnosis not present

## 2019-08-30 DIAGNOSIS — R0602 Shortness of breath: Secondary | ICD-10-CM | POA: Diagnosis not present

## 2019-08-30 DIAGNOSIS — R0789 Other chest pain: Secondary | ICD-10-CM | POA: Insufficient documentation

## 2019-08-30 DIAGNOSIS — R072 Precordial pain: Secondary | ICD-10-CM | POA: Diagnosis not present

## 2019-08-30 DIAGNOSIS — Z79899 Other long term (current) drug therapy: Secondary | ICD-10-CM | POA: Insufficient documentation

## 2019-08-30 DIAGNOSIS — R079 Chest pain, unspecified: Secondary | ICD-10-CM | POA: Diagnosis not present

## 2019-08-30 DIAGNOSIS — I10 Essential (primary) hypertension: Secondary | ICD-10-CM | POA: Diagnosis not present

## 2019-08-30 LAB — TROPONIN I (HIGH SENSITIVITY)
Troponin I (High Sensitivity): 4 ng/L (ref <18)
Troponin I (High Sensitivity): 4 ng/L (ref <18)

## 2019-08-30 LAB — BLOOD GAS, ARTERIAL
Acid-Base Excess: 0.2 mmol/L (ref 0.0–2.0)
Bicarbonate: 25.4 mmol/L (ref 20.0–28.0)
O2 Content: 2 L/min
O2 Saturation: 98.2 %
Patient temperature: 97.9
pCO2 arterial: 45 mmHg (ref 32.0–48.0)
pH, Arterial: 7.367 (ref 7.350–7.450)
pO2, Arterial: 111 mmHg — ABNORMAL HIGH (ref 83.0–108.0)

## 2019-08-30 LAB — HEPATIC FUNCTION PANEL
ALT: 13 U/L (ref 0–44)
AST: 19 U/L (ref 15–41)
Albumin: 3.5 g/dL (ref 3.5–5.0)
Alkaline Phosphatase: 78 U/L (ref 38–126)
Bilirubin, Direct: 0.1 mg/dL (ref 0.0–0.2)
Indirect Bilirubin: 0.4 mg/dL (ref 0.3–0.9)
Total Bilirubin: 0.5 mg/dL (ref 0.3–1.2)
Total Protein: 7.8 g/dL (ref 6.5–8.1)

## 2019-08-30 LAB — BASIC METABOLIC PANEL
Anion gap: 10 (ref 5–15)
BUN: 23 mg/dL (ref 8–23)
CO2: 24 mmol/L (ref 22–32)
Calcium: 8.5 mg/dL — ABNORMAL LOW (ref 8.9–10.3)
Chloride: 105 mmol/L (ref 98–111)
Creatinine, Ser: 1.48 mg/dL — ABNORMAL HIGH (ref 0.44–1.00)
GFR calc Af Amer: 42 mL/min — ABNORMAL LOW (ref >60)
GFR calc non Af Amer: 37 mL/min — ABNORMAL LOW (ref >60)
Glucose, Bld: 211 mg/dL — ABNORMAL HIGH (ref 70–99)
Potassium: 3.1 mmol/L — ABNORMAL LOW (ref 3.5–5.1)
Sodium: 139 mmol/L (ref 135–145)

## 2019-08-30 LAB — CBC
HCT: 35.2 % — ABNORMAL LOW (ref 36.0–46.0)
Hemoglobin: 11.5 g/dL — ABNORMAL LOW (ref 12.0–15.0)
MCH: 27.4 pg (ref 26.0–34.0)
MCHC: 32.7 g/dL (ref 30.0–36.0)
MCV: 84 fL (ref 80.0–100.0)
Platelets: 262 10*3/uL (ref 150–400)
RBC: 4.19 MIL/uL (ref 3.87–5.11)
RDW: 15.1 % (ref 11.5–15.5)
WBC: 7.9 10*3/uL (ref 4.0–10.5)
nRBC: 0 % (ref 0.0–0.2)

## 2019-08-30 LAB — AMMONIA: Ammonia: 18 umol/L (ref 9–35)

## 2019-08-30 LAB — LIPASE, BLOOD: Lipase: 22 U/L (ref 11–51)

## 2019-08-30 LAB — MAGNESIUM: Magnesium: 1.7 mg/dL (ref 1.7–2.4)

## 2019-08-30 MED ORDER — SODIUM CHLORIDE 0.9 % IV BOLUS
500.0000 mL | Freq: Once | INTRAVENOUS | Status: AC
  Administered 2019-08-30: 500 mL via INTRAVENOUS

## 2019-08-30 NOTE — ED Triage Notes (Signed)
Central chest pain starting approx 2000 tonight and short of breath.

## 2019-08-30 NOTE — ED Notes (Signed)
Patient reports she ate edible gummies. Pt denies having any chest pain at this time. Will continue to monitor.

## 2019-08-30 NOTE — ED Provider Notes (Signed)
[facility]-EMERGENCY DEPT Provider Note   CSN: [id number] Arrival date & time: 08/30/19  2037     History Chief Complaint  Patient presents with  . Chest Pain    Annette Walker is a 69 y.o. female.  The history is provided by the patient and medical records. No language interpreter was used.  Shortness of Breath Severity:  Moderate Onset quality:  Gradual Duration:  2 hours Timing:  Constant Progression:  Improving Chronicity:  New Relieved by:  Nothing Worsened by:  Nothing Ineffective treatments:  None tried Associated symptoms: chest pain   Associated symptoms: no abdominal pain, no cough, no diaphoresis, no fever, no headaches, no sputum production, no vomiting and no wheezing   Risk factors: obesity        Past Medical History:  Diagnosis Date  . Hypertension     Patient Active Problem List   Diagnosis Date Noted  . Microcytic anemia 09/21/2015  . Acute respiratory failure (HCC)   . Open abdominal wall wound 09/16/2015  . Necrotizing fasciitis (HCC) 09/16/2015  . Abdominal pain, acute   . Acute hypoxemic respiratory failure (HCC)   . Respiratory failure (HCC)   . Lactic acidosis   . AKI (acute kidney injury) [facility])     Past Surgical History:  Procedure Laterality Date  . IRRIGATION AND DEBRIDEMENT ABDOMEN N/A 09/15/2015   Procedure: IRRIGATION AND DEBRIDEMENT ABDOMEN;  Surgeon: Harriette Bouillon, MD;  Location: [facility] OR;  Service: General;  Laterality: N/A;     OB History   No obstetric history on file.     No family history on file.  Social History   Tobacco Use  . Smoking status: Never Smoker  . Smokeless tobacco: Never Used  Substance Use Topics  . Alcohol use: No  . Drug use: No    Home Medications Prior to Admission medications   Medication Sig Start Date End Date Taking? Authorizing Provider  amoxicillin-clavulanate (AUGMENTIN) 875-125 MG tablet Take 1 tablet by mouth every 12 (twelve) hours. 09/21/15   Renae Fickle, MD  aspirin 81 MG tablet Take 81 mg by mouth daily.    [provider]  HYDROcodone-acetaminophen (NORCO/VICODIN) 5-325 MG tablet Take 1 tablet by mouth every 6 (six) hours as needed for moderate pain or severe pain. 09/21/15   Renae Fickle, MD  metoprolol succinate (TOPROL-XL) 25 MG 24 hr tablet Take 1 tablet (25 mg total) by mouth daily. 09/21/15   Renae Fickle, MD  saccharomyces boulardii (FLORASTOR) 250 MG capsule Take 1 capsule (250 mg total) by mouth 2 (two) times daily. 09/21/15   Renae Fickle, MD    Allergies    Patient has no known allergies.  Review of Systems   Review of Systems  Unable to perform ROS: Mental status change  Constitutional: Positive for fatigue. Negative for chills, diaphoresis and fever.  HENT: Negative for congestion.   Respiratory: Positive for chest tightness and shortness of breath. Negative for cough, sputum production and wheezing.   Cardiovascular: Positive for chest pain. Negative for palpitations and leg swelling.  Gastrointestinal: Negative for abdominal pain, constipation, diarrhea, nausea and vomiting.  Genitourinary: Negative for dysuria.  Musculoskeletal: Negative for back pain.  Neurological: Negative for light-headedness and headaches.  Psychiatric/Behavioral: Negative for agitation.    Physical Exam Updated Vital Signs BP (!) 145/82 (BP Location: Right Arm)   Pulse 78   Temp 97.9 F (36.6 C) (Oral)   Resp (!) 21   Ht 5\' 6"  (1.676 m)   Wt  127 kg   SpO2 96%   BMI 45.19 kg/m   Physical Exam Vitals and nursing note reviewed.  Constitutional:      General: She is not in acute distress.    Appearance: She is well-developed. She is not ill-appearing, toxic-appearing or diaphoretic.  HENT:     Head: Normocephalic and atraumatic.     Right Ear: External ear normal.     Left Ear: External ear normal.     Nose: Nose normal.     Mouth/Throat:     Pharynx: No oropharyngeal exudate.  Eyes:      Conjunctiva/sclera: Conjunctivae normal.     Pupils: Pupils are equal, round, and reactive to light.  Cardiovascular:     Rate and Rhythm: Normal rate and regular rhythm.     Heart sounds: Normal heart sounds. No murmur.  Pulmonary:     Effort: Pulmonary effort is normal. No tachypnea or respiratory distress.     Breath sounds: No stridor. No decreased breath sounds, wheezing, rhonchi or rales.  Chest:     Chest wall: No tenderness.  Abdominal:     General: There is no distension.     Palpations: Abdomen is soft.     Tenderness: There is no abdominal tenderness. There is no rebound.  Musculoskeletal:     Cervical back: Normal range of motion and neck supple.     Right lower leg: No tenderness. No edema.     Left lower leg: No tenderness. No edema.  Skin:    General: Skin is warm.     Capillary Refill: Capillary refill takes less than 2 seconds.     Findings: No erythema or rash.  Neurological:     General: No focal deficit present.     Mental Status: She is alert and oriented to person, place, and time.     Motor: No abnormal muscle tone.     Coordination: Coordination normal.     Deep Tendon Reflexes: Reflexes are normal and symmetric.  Psychiatric:        Mood and Affect: Mood normal. Mood is not anxious.     ED Results / Procedures / Treatments   Labs (all labs ordered are listed, but only abnormal results are displayed) Labs Reviewed  BASIC METABOLIC PANEL - Abnormal; Notable for the following components:      Result Value   Potassium 3.1 (*)    Glucose, Bld 211 (*)    Creatinine, Ser 1.48 (*)    Calcium 8.5 (*)    GFR calc non Af Amer 37 (*)    GFR calc Af Amer 42 (*)    All other components within normal limits  CBC - Abnormal; Notable for the following components:   Hemoglobin 11.5 (*)    HCT 35.2 (*)    All other components within normal limits  BLOOD GAS, ARTERIAL - Abnormal; Notable for the following components:   pO2, Arterial 111 (*)    All other  components within normal limits  LIPASE, BLOOD  HEPATIC FUNCTION PANEL  AMMONIA  MAGNESIUM  ETHANOL  RAPID URINE DRUG SCREEN, HOSP PERFORMED  TROPONIN I (HIGH SENSITIVITY)  TROPONIN I (HIGH SENSITIVITY)    EKG EKG Interpretation  Date/Time:  Friday August 30 2019 20:46:43 EDT Ventricular Rate:  109 PR Interval:    QRS Duration: 92 QT Interval:  347 QTC Calculation: 468 R Axis:   8 Text Interpretation: Sinus tachycardia Low voltage, precordial leads Baseline wander in lead(s) V4 12 Lead; Mason-Likar When compared  to prior, faster rate. No STEMI Confirmed by Theda Belfast ([id number]) on 08/31/2019 12:49:32 AM   Radiology DG Chest 2 View  Result Date: 08/30/2019 CLINICAL DATA:  Shortness of breath. Central chest pain. EXAM: CHEST - 2 VIEW COMPARISON:  09/16/2015 FINDINGS: Low lung volumes limit assessment. There streaky bibasilar opacities. Heart is normal in size. No pulmonary edema, large pleural effusion, or pneumothorax. No acute osseous abnormalities are seen. Degenerative change in the spine IMPRESSION: Low lung volumes with streaky bibasilar opacities, favor atelectasis. Pneumonia could have a similar appearance in the appropriate clinical setting. Electronically Signed   By: Narda Rutherford M.D.   On: 08/30/2019 21:19    Procedures Procedures (including critical care time)  Medications Ordered in ED Medications  sodium chloride 0.9 % bolus 500 mL (0 mLs Intravenous Stopped 08/30/19 2328)  potassium chloride SA (KLOR-CON) CR tablet 40 mEq (40 mEq Oral Given 08/31/19 0101)    ED Course  I have reviewed the triage vital signs and the nursing notes.  Pertinent labs & imaging results that were available during my care of the patient were reviewed by me and considered in my medical decision making (see chart for details).    MDM Rules/Calculators/A&P                      Annette Walker is a 69 y.o. female with a past medical history significant for hypertension and prior  abdominal infection and prior hypoxic respiratory failure who presents with chest pain and shortness of breath after taking a THC laced edible gummy bear.  Patient reports that at around 8 PM, she ate a gummy bear that was dosed with THC.  She says that following this, she developed chest tightness and chest pressure and shortness of breath.  Patient is also feeling very sleepy and tired.  She reports that the chest pain and shortness of breath have resolved but she was found to have some hypoxia on arrival to the emergency department.  Patient was placed on oxygen.  She thinks this is all due to the gummy bear but denies other complaints.  She denies any recent fevers, chills, or cough.  She denies any nausea, vomiting, diaphoresis, urinary symptoms or other GI symptoms.  On arrival, patient is resting comfortably in no acute distress but is very tired and somnolent.  On exam, patient is arousable to voice but I have to yell at her to keep her awake to answer questions.  Lungs were clear and chest was nontender.  Abdomen was nontender.  Patient moving all extremities.  Lower extremities were nontender and not significantly edematous.   EKG shows no STEMI.       Clinically aspect patient is altered and somnolent due to the gummy bear relaxing her and suppressing her breathing.  She otherwise has reassuring vital signs now.  During my entire time evaluating her, she has not had tachycardia or tachypnea.  We will try to reduce her oxygen requirement as she metabolizes the drug.  Patient will have labs and imaging to further evaluate for other etiologies of chest pain.  Work-up again returned showing a negative troponin.  Lipase not elevated.  Ammonia and magnesium normal.  Liver function normal.  Potassium is low at 3.1, will order potassium segmentation.  Creatinine elevated at 1.48 which appears to be similar to elevated creatinine she has had over the last few years.  Mild anemia present no leukocytosis.   ABG shows nonelevated CO2.  Anticipate reassessment  of the patient after she metabolizes the gummy bear.  If she is still complaining of symptoms, would consider further work-up however she is feeling better and off of oxygen, anticipate discharge with PCP follow-up.  1:15 AM Patient was reexamined and she was weaned off of oxygen.  She now reports that a feeling of her gave her 2 Gummies and she has never done drugs in the past.  Suspect patient accidentally took too much THC when she has never had it before causing her to be somnolent and breathe less.  Patient is starting to wake up.  Care transferred while awaiting reassessment.  Anticipate discharge given her improving appearance.  She still denies any further chest pain or shortness of breath and is feeling much better.   Final Clinical Impression(s) / ED Diagnoses Final diagnoses:  Use of cannabinoid edibles  Precordial pain  Shortness of breath     Clinical Impression: 1. Use of cannabinoid edibles   2. Precordial pain   3. Shortness of breath     Disposition: Care transferred to oncoming team while awaiting reassessment after metabolizing.  Anticipate discharge if she is feeling better.  This note was prepared with assistance of Systems analyst. Occasional wrong-word or sound-a-like substitutions may have occurred due to the inherent limitations of voice recognition software.     Karianne Nogueira, Gwenyth Allegra, MD 08/31/19 [phone]

## 2019-08-30 NOTE — ED Notes (Signed)
Patients SPO2 dropped to 79% on 2L of oxygen. Oxygen titrated up to 4L. SPO2 now 100%. Will continue to monitor.

## 2019-08-30 NOTE — ED Notes (Signed)
Patients SPO2 is 86% RA. Patient states she does feel short of breath. Pt placed on 2L via nasal cannula. SPO2 currently 96%.

## 2019-08-30 NOTE — ED Notes (Signed)
Angelique Blonder (Sister) [phone]

## 2019-08-31 DIAGNOSIS — R0789 Other chest pain: Secondary | ICD-10-CM | POA: Diagnosis not present

## 2019-08-31 DIAGNOSIS — I10 Essential (primary) hypertension: Secondary | ICD-10-CM | POA: Diagnosis not present

## 2019-08-31 DIAGNOSIS — R0602 Shortness of breath: Secondary | ICD-10-CM | POA: Diagnosis not present

## 2019-08-31 DIAGNOSIS — F1290 Cannabis use, unspecified, uncomplicated: Secondary | ICD-10-CM | POA: Diagnosis not present

## 2019-08-31 DIAGNOSIS — R400 Somnolence: Secondary | ICD-10-CM | POA: Diagnosis not present

## 2019-08-31 DIAGNOSIS — Z79899 Other long term (current) drug therapy: Secondary | ICD-10-CM | POA: Diagnosis not present

## 2019-08-31 LAB — RAPID URINE DRUG SCREEN, HOSP PERFORMED
Amphetamines: NOT DETECTED
Barbiturates: NOT DETECTED
Benzodiazepines: NOT DETECTED
Cocaine: NOT DETECTED
Opiates: NOT DETECTED
Tetrahydrocannabinol: POSITIVE — AB

## 2019-08-31 LAB — ETHANOL: Alcohol, Ethyl (B): <10 mg/dL (ref <10)

## 2019-08-31 MED ORDER — POTASSIUM CHLORIDE CRYS ER 20 MEQ PO TBCR
40.0000 meq | EXTENDED_RELEASE_TABLET | Freq: Once | ORAL | Status: AC
  Administered 2019-08-31: 40 meq via ORAL
  Filled 2019-08-31: qty 2

## 2019-08-31 NOTE — ED Provider Notes (Signed)
Pt improved, she is ambulatory, will discharge home    Zadie Rhine, MD 08/31/19 [phone]

## 2019-08-31 NOTE — ED Notes (Signed)
Patient ambulated to restroom, gait steady. Attempting to obtain urine sample at this time. States she feels better and is ready to go.

## 2019-08-31 NOTE — ED Notes (Signed)
Patients family Angelique Blonder) was contacted to pick her up.

## 2019-12-19 ENCOUNTER — Other Ambulatory Visit (HOSPITAL_COMMUNITY): Payer: Self-pay | Admitting: Internal Medicine

## 2019-12-19 DIAGNOSIS — E2839 Other primary ovarian failure: Secondary | ICD-10-CM | POA: Diagnosis not present

## 2019-12-19 DIAGNOSIS — M25571 Pain in right ankle and joints of right foot: Secondary | ICD-10-CM | POA: Diagnosis not present

## 2019-12-19 DIAGNOSIS — K219 Gastro-esophageal reflux disease without esophagitis: Secondary | ICD-10-CM | POA: Diagnosis not present

## 2019-12-19 DIAGNOSIS — E559 Vitamin D deficiency, unspecified: Secondary | ICD-10-CM | POA: Diagnosis not present

## 2019-12-19 DIAGNOSIS — M25572 Pain in left ankle and joints of left foot: Secondary | ICD-10-CM | POA: Diagnosis not present

## 2019-12-19 DIAGNOSIS — I10 Essential (primary) hypertension: Secondary | ICD-10-CM | POA: Diagnosis not present

## 2019-12-19 DIAGNOSIS — Z1239 Encounter for other screening for malignant neoplasm of breast: Secondary | ICD-10-CM | POA: Diagnosis not present

## 2019-12-24 ENCOUNTER — Other Ambulatory Visit: Payer: Self-pay | Admitting: Internal Medicine

## 2019-12-24 DIAGNOSIS — E2839 Other primary ovarian failure: Secondary | ICD-10-CM

## 2019-12-29 ENCOUNTER — Emergency Department (HOSPITAL_COMMUNITY): Payer: 59

## 2019-12-29 DIAGNOSIS — R002 Palpitations: Secondary | ICD-10-CM | POA: Diagnosis not present

## 2019-12-29 DIAGNOSIS — Z79899 Other long term (current) drug therapy: Secondary | ICD-10-CM | POA: Insufficient documentation

## 2019-12-29 DIAGNOSIS — Z20822 Contact with and (suspected) exposure to covid-19: Secondary | ICD-10-CM | POA: Insufficient documentation

## 2019-12-29 DIAGNOSIS — I129 Hypertensive chronic kidney disease with stage 1 through stage 4 chronic kidney disease, or unspecified chronic kidney disease: Secondary | ICD-10-CM | POA: Diagnosis not present

## 2019-12-29 DIAGNOSIS — R0789 Other chest pain: Secondary | ICD-10-CM | POA: Diagnosis not present

## 2019-12-29 DIAGNOSIS — I209 Angina pectoris, unspecified: Secondary | ICD-10-CM | POA: Insufficient documentation

## 2019-12-29 DIAGNOSIS — R079 Chest pain, unspecified: Secondary | ICD-10-CM | POA: Diagnosis not present

## 2019-12-29 DIAGNOSIS — N183 Chronic kidney disease, stage 3 unspecified: Secondary | ICD-10-CM | POA: Insufficient documentation

## 2019-12-30 ENCOUNTER — Other Ambulatory Visit: Payer: Self-pay

## 2019-12-30 ENCOUNTER — Observation Stay (HOSPITAL_COMMUNITY)
Admission: EM | Admit: 2019-12-30 | Discharge: 2019-12-31 | Disposition: A | Discharge status: Home / Self Care | Payer: 59 | Attending: Family Medicine | Admitting: Family Medicine

## 2019-12-30 ENCOUNTER — Encounter (HOSPITAL_COMMUNITY): Payer: Self-pay | Admitting: Emergency Medicine

## 2019-12-30 DIAGNOSIS — N1831 Chronic kidney disease, stage 3a: Secondary | ICD-10-CM

## 2019-12-30 DIAGNOSIS — R002 Palpitations: Secondary | ICD-10-CM | POA: Diagnosis not present

## 2019-12-30 DIAGNOSIS — N183 Chronic kidney disease, stage 3 unspecified: Secondary | ICD-10-CM | POA: Diagnosis not present

## 2019-12-30 DIAGNOSIS — R079 Chest pain, unspecified: Secondary | ICD-10-CM | POA: Diagnosis present

## 2019-12-30 DIAGNOSIS — I209 Angina pectoris, unspecified: Secondary | ICD-10-CM | POA: Diagnosis not present

## 2019-12-30 DIAGNOSIS — I129 Hypertensive chronic kidney disease with stage 1 through stage 4 chronic kidney disease, or unspecified chronic kidney disease: Secondary | ICD-10-CM | POA: Diagnosis not present

## 2019-12-30 DIAGNOSIS — Z79899 Other long term (current) drug therapy: Secondary | ICD-10-CM | POA: Diagnosis not present

## 2019-12-30 DIAGNOSIS — Z20822 Contact with and (suspected) exposure to covid-19: Secondary | ICD-10-CM | POA: Diagnosis not present

## 2019-12-30 DIAGNOSIS — R0789 Other chest pain: Secondary | ICD-10-CM | POA: Diagnosis not present

## 2019-12-30 DIAGNOSIS — I10 Essential (primary) hypertension: Secondary | ICD-10-CM | POA: Diagnosis not present

## 2019-12-30 DIAGNOSIS — I208 Other forms of angina pectoris: Secondary | ICD-10-CM

## 2019-12-30 LAB — RAPID URINE DRUG SCREEN, HOSP PERFORMED
Amphetamines: NOT DETECTED
Barbiturates: NOT DETECTED
Benzodiazepines: NOT DETECTED
Cocaine: NOT DETECTED
Opiates: NOT DETECTED
Tetrahydrocannabinol: NOT DETECTED

## 2019-12-30 LAB — CBC
HCT: 36.2 % (ref 36.0–46.0)
Hemoglobin: 12 g/dL (ref 12.0–15.0)
MCH: 27.6 pg (ref 26.0–34.0)
MCHC: 33.1 g/dL (ref 30.0–36.0)
MCV: 83.4 fL (ref 80.0–100.0)
Platelets: 257 10*3/uL (ref 150–400)
RBC: 4.34 MIL/uL (ref 3.87–5.11)
RDW: 14.6 % (ref 11.5–15.5)
WBC: 5.9 10*3/uL (ref 4.0–10.5)
nRBC: 0 % (ref 0.0–0.2)

## 2019-12-30 LAB — BASIC METABOLIC PANEL
Anion gap: 13 (ref 5–15)
BUN: 35 mg/dL — ABNORMAL HIGH (ref 8–23)
CO2: 23 mmol/L (ref 22–32)
Calcium: 8.9 mg/dL (ref 8.9–10.3)
Chloride: 103 mmol/L (ref 98–111)
Creatinine, Ser: 1.63 mg/dL — ABNORMAL HIGH (ref 0.44–1.00)
GFR calc Af Amer: 38 mL/min — ABNORMAL LOW (ref >60)
GFR calc non Af Amer: 32 mL/min — ABNORMAL LOW (ref >60)
Glucose, Bld: 114 mg/dL — ABNORMAL HIGH (ref 70–99)
Potassium: 3.2 mmol/L — ABNORMAL LOW (ref 3.5–5.1)
Sodium: 139 mmol/L (ref 135–145)

## 2019-12-30 LAB — TROPONIN I (HIGH SENSITIVITY)
Troponin I (High Sensitivity): 10 ng/L (ref <18)
Troponin I (High Sensitivity): 16 ng/L (ref <18)
Troponin I (High Sensitivity): 31 ng/L — ABNORMAL HIGH (ref <18)

## 2019-12-30 LAB — D-DIMER, QUANTITATIVE: D-Dimer, Quant: 1.36 ug/mL-FEU — ABNORMAL HIGH (ref 0.00–0.50)

## 2019-12-30 LAB — SARS CORONAVIRUS 2 BY RT PCR (HOSPITAL ORDER, PERFORMED IN ~~LOC~~ HOSPITAL LAB): SARS Coronavirus 2: NEGATIVE

## 2019-12-30 MED ORDER — ONDANSETRON HCL 4 MG/2ML IJ SOLN: 4.0000 mg | Freq: Four times a day (QID) | INTRAMUSCULAR | Status: DC | PRN

## 2019-12-30 MED ORDER — PANTOPRAZOLE SODIUM 40 MG PO TBEC
40.0000 mg | DELAYED_RELEASE_TABLET | Freq: Two times a day (BID) | ORAL | Status: DC
  Administered 2019-12-30: 40 mg via ORAL
  Filled 2019-12-30: qty 1

## 2019-12-30 MED ORDER — ALUM & MAG HYDROXIDE-SIMETH 200-200-20 MG/5ML PO SUSP
30.0000 mL | Freq: Once | ORAL | Status: AC
  Administered 2019-12-30: 30 mL via ORAL
  Filled 2019-12-30: qty 30

## 2019-12-30 MED ORDER — ASPIRIN EC 81 MG PO TBEC
81.0000 mg | DELAYED_RELEASE_TABLET | Freq: Every day | ORAL | Status: DC
  Administered 2019-12-30: 81 mg via ORAL
  Filled 2019-12-30: qty 1

## 2019-12-30 MED ORDER — ACETAMINOPHEN 325 MG PO TABS: 650.0000 mg | ORAL_TABLET | ORAL | Status: DC | PRN

## 2019-12-30 MED ORDER — METOPROLOL TARTRATE 25 MG PO TABS
100.0000 mg | ORAL_TABLET | Freq: Two times a day (BID) | ORAL | Status: DC
  Filled 2019-12-30: qty 4

## 2019-12-30 MED ORDER — SODIUM CHLORIDE 0.9 % IV SOLN: INTRAVENOUS | Status: DC

## 2019-12-30 MED ORDER — ASPIRIN 81 MG PO CHEW
324.0000 mg | CHEWABLE_TABLET | Freq: Once | ORAL | Status: AC
  Administered 2019-12-30: 324 mg via ORAL
  Filled 2019-12-30: qty 4

## 2019-12-30 MED ORDER — LIDOCAINE VISCOUS HCL 2 % MT SOLN
15.0000 mL | Freq: Once | OROMUCOSAL | Status: AC
  Administered 2019-12-30: 15 mL via ORAL
  Filled 2019-12-30: qty 15

## 2019-12-30 MED ORDER — ENOXAPARIN SODIUM 40 MG/0.4ML ~~LOC~~ SOLN
40.0000 mg | SUBCUTANEOUS | Status: DC
  Administered 2019-12-30: 40 mg via SUBCUTANEOUS
  Filled 2019-12-30: qty 0.4

## 2019-12-30 NOTE — ED Provider Notes (Signed)
[facility]-EMERGENCY DEPT Provider Note   CSN: [id number] Arrival date & time: 12/29/19  2319     History Chief Complaint  Patient presents with  . Palpitations    Annette Walker is a 69 y.o. female.  Patient is a 69 year old female with a history of hypertension and prior necrotizing fasciitis who presents today with complaints of palpitations and chest pain.  Patient reports that last night while she was watching TV sitting on the couch around 1045 she had a 2 to 3-minute episode of her heart racing.  She said after that occurred she started having a discomfort in her upper abdomen and chest.  She states that discomfort has been there pretty much throughout the night while she has been waiting for the last 8 hours.  She denies that it is an actual pain but just a discomfort that has finally significantly improved now that she is laid down.  She has had no cough or shortness of breath throughout this event but has had some occasional nausea.  She has recently got back from the beach yesterday but the trip was only a 3 to 4-hour drive.  She has not had any unilateral leg pain or swelling.  She has no prior history of PE.  She does report that her diet has been different since being on vacation and she has not had a good bowel movement in the last few days.  She does suffer from GERD but has continued to take her reflux medication.  She has also continued to take her blood pressure medications except for her morning medication since she is here today.  No prior history of heart disease and it does not run in her family.  She denies any tobacco, illicit drug use.  She has been having a glass of wine or 2 for the last week while at the beach.  The history is provided by the patient.  Palpitations Palpitations quality:  Fast Onset quality:  At rest Duration:  2 minutes Timing:  Constant Progression:  Improving Chronicity:  New Context: not blood loss, not caffeine, not  dehydration, not illicit drugs, not nicotine and not stimulant use   Relieved by:  None tried Worsened by:  Nothing Ineffective treatments:  None tried Associated symptoms: chest pressure and nausea   Associated symptoms: no back pain, no chest pain, no cough, no lower extremity edema, no shortness of breath, no vomiting and no weakness   Risk factors: no diabetes mellitus, no heart disease and no hx of PE   Risk factors comment:  Htn      Past Medical History:  Diagnosis Date  . Hypertension     Patient Active Problem List   Diagnosis Date Noted  . Microcytic anemia 09/21/2015  . Acute respiratory failure (HCC)   . Open abdominal wall wound 09/16/2015  . Necrotizing fasciitis (HCC) 09/16/2015  . Abdominal pain, acute   . Acute hypoxemic respiratory failure (HCC)   . Respiratory failure (HCC)   . Lactic acidosis   . AKI (acute kidney injury) [facility](HCC)     Past Surgical History:  Procedure Laterality Date  . IRRIGATION AND DEBRIDEMENT ABDOMEN N/A 09/15/2015   Procedure: IRRIGATION AND DEBRIDEMENT ABDOMEN;  Surgeon: Harriette Bouillonhomas Cornett, MD;  Location: [facility] OR;  Service: General;  Laterality: N/A;     OB History   No obstetric history on file.     History reviewed. No pertinent family history.  Social History   Tobacco Use  . Smoking  status: Never Smoker  . Smokeless tobacco: Never Used  Substance Use Topics  . Alcohol use: No  . Drug use: No    Home Medications Prior to Admission medications   Medication Sig Start Date End Date Taking? Authorizing Provider  aspirin 81 MG tablet Take 81 mg by mouth daily.    [provider]  metoprolol succinate (TOPROL-XL) 25 MG 24 hr tablet Take 1 tablet (25 mg total) by mouth daily. Patient not taking: Reported on 08/31/2019 09/21/15   Renae Fickle, MD  metoprolol tartrate (LOPRESSOR) 100 MG tablet Take 100 mg by mouth 2 (two) times daily. 08/12/19   [provider]  olmesartan-hydrochlorothiazide (BENICAR HCT) 40-25  MG tablet Take 1 tablet by mouth daily. 08/28/19   [provider]  pantoprazole (PROTONIX) 40 MG tablet Take 40 mg by mouth daily. 08/12/19   [provider]    Allergies    Patient has no known allergies.  Review of Systems   Review of Systems  Respiratory: Negative for cough and shortness of breath.   Cardiovascular: Positive for palpitations. Negative for chest pain.  Gastrointestinal: Positive for nausea. Negative for vomiting.  Musculoskeletal: Negative for back pain.  Neurological: Negative for weakness.  All other systems reviewed and are negative.   Physical Exam Updated Vital Signs BP (!) 152/86   Pulse (!) 54   Temp 98.5 F (36.9 C) (Oral)   Resp 18   Ht 5\' 6"  (1.676 m)   Wt 124.3 kg   SpO2 99%   BMI 44.22 kg/m   Physical Exam Vitals and nursing note reviewed.  Constitutional:      General: She is not in acute distress.    Appearance: She is well-developed. She is obese.  HENT:     Head: Normocephalic and atraumatic.     Mouth/Throat:     Mouth: Mucous membranes are moist.  Eyes:     Conjunctiva/sclera: Conjunctivae normal.     Pupils: Pupils are equal, round, and reactive to light.  Cardiovascular:     Rate and Rhythm: Normal rate and regular rhythm.     Pulses: Normal pulses.     Heart sounds: No murmur heard.   Pulmonary:     Effort: Pulmonary effort is normal. No respiratory distress.     Breath sounds: Normal breath sounds. No wheezing or rales.  Abdominal:     General: There is no distension.     Palpations: Abdomen is soft.     Tenderness: There is abdominal tenderness in the epigastric area. There is no guarding or rebound.  Musculoskeletal:        General: No tenderness. Normal range of motion.     Cervical back: Normal range of motion and neck supple.     Right lower leg: No edema.     Left lower leg: No edema.  Skin:    General: Skin is warm and dry.     Capillary Refill: Capillary refill takes less than 2 seconds.      Findings: No erythema or rash.  Neurological:     General: No focal deficit present.     Mental Status: She is alert and oriented to person, place, and time. Mental status is at baseline.  Psychiatric:        Mood and Affect: Mood normal.        Behavior: Behavior normal.        Thought Content: Thought content normal.     ED Results / Procedures / Treatments  Labs (all labs ordered are listed, but only abnormal results are displayed) Labs Reviewed  BASIC METABOLIC PANEL - Abnormal; Notable for the following components:      Result Value   Potassium 3.2 (*)    Glucose, Bld 114 (*)    BUN 35 (*)    Creatinine, Ser 1.63 (*)    GFR calc non Af Amer 32 (*)    GFR calc Af Amer 38 (*)    All other components within normal limits  TROPONIN I (HIGH SENSITIVITY) - Abnormal; Notable for the following components:   Troponin I (High Sensitivity) 31 (*)    All other components within normal limits  CBC  TROPONIN I (HIGH SENSITIVITY)    EKG EKG Interpretation  Date/Time:  Sunday December 29 2019 23:48:30 EDT Ventricular Rate:  66 PR Interval:    QRS Duration: 96 QT Interval:  401 QTC Calculation: 421 R Axis:   2 Text Interpretation: Sinus rhythm Low voltage, precordial leads Abnormal R-wave progression, early transition Rate is slower Confirmed by Molpus, John ([id number]) on 12/29/2019 11:59:47 PM   Radiology DG Chest 2 View  Result Date: 12/30/2019 CLINICAL DATA:  Chest pain. EXAM: CHEST - 2 VIEW COMPARISON:  08/30/2019 FINDINGS: Slightly improved but persistent low lung volumes from prior exam.The cardiomediastinal contours are normal. Improved streaky bibasilar opacities from prior exam. Pulmonary vasculature is normal. No consolidation, pleural effusion, or pneumothorax. No acute osseous abnormalities are seen. IMPRESSION: Low lung volumes without acute abnormality. Electronically Signed   By: Melanie  Sanford M.D.   On: 12/30/2019 00:07    Procedures Procedures (including  critical care time)  Medications Ordered in ED Medications - No data to display  ED Course  I have reviewed the triage vital signs and the nursing notes.  Pertinent labs & imaging results that were available during my care of the patient were reviewed by me and considered in my medical decision making (see chart for details).    MDM Rules/Calculators/A&P                          68  year old female presenting today with palpitations and chest discomfort.  Started with palpitations for a few minutes and then has had persistent chest discomfort for the last 8 hours.  She states it is worse with sitting up and it has resolved now that she is laid down.  No shortness of breath or cough related to this.  No recent infectious symptoms.  However she has had some burping and discomfort.  She does suffer from GERD and has recently been on vacation where she has not been eating her typical diet and has not had a normal bowel movement in the last few days.  On exam she does have mild epigastric tenderness which she reports also causes her to have chest discomfort with palpation.  Low suspicion for PE, dissection, pneumonia or Covid.  Patient does not have exam findings concerning for hepatitis, cholecystitis or pancreatitis.  Unclear if palpitations were related to a dysrhythmia as patient is not having the symptoms now and EKG shows a normal sinus rhythm.  EKG is unchanged from prior EKGs however some concern for ACS based on story and patient does have a heart score of 4 putting her at moderate risk.  Initial troponin is 10 and delta troponin is pending.  Patient given GI cocktail.   Patient's symptoms could also be related to GI origin.  CBC without acute findings, BMP with mild  hypokalemia of 3.2 and creatinine of 1.63 which seems similar to her prior.    9:49 AM Pt is pain free.  Delta trop is elevated to 31 from 10.  With heart score of 4 will need r/o.  Pt given 325mg  of ASA.  Will consult  hospitalist.  MDM Number of Diagnoses or Management Options   Amount and/or Complexity of Data Reviewed Clinical lab tests: ordered and reviewed Tests in the radiology section of CPT: ordered and reviewed Tests in the medicine section of CPT: ordered and reviewed Decide to obtain previous medical records or to obtain history from someone other than the patient: yes Obtain history from someone other than the patient: no Review and summarize past medical records: yes Discuss the patient with other providers: yes Independent visualization of images, tracings, or specimens: yes  Risk of Complications, Morbidity, and/or Mortality Presenting problems: moderate Diagnostic procedures: low Management options: low  Patient Progress Patient progress: stable    Final Clinical Impression(s) / ED Diagnoses Final diagnoses:  Palpitations  Angina at rest [facility])    Rx / DC Orders ED Discharge Orders    None       [facility], MD 12/30/19 [id number]

## 2019-12-30 NOTE — ED Notes (Signed)
Save blue tube in main lab °

## 2019-12-30 NOTE — H&P (Signed)
History and Physical  Annette Walker WNI:[id number] DOB: Aug 13, 1950 DOA: 12/30/2019  PCP: [name], MD Patient coming from: Home  I have personally briefly reviewed patient's old medical records in [facility]   Chief Complaint: Palpitations  HPI: Annette Walker is a very nice 69 y.o. female past medical history of hypertension and prior necrotizing fasciitis, will just return from vacationing at [facility] when she started having palpitation and chest pain she relates nothing makes it better or worse it lasted about 2 to 3 minutes she had no associated symptoms with it.  She denies any unilateral leg pain or swelling, no shortness of breath.  She does relate that her diet was significantly different down at [facility] she continue to take her reflux medication.  She denies any tobacco use or drugs she did consume alcohol down at [facility], she denies any nausea vomiting, sweating or diarrhea.  In the ED: Satting 100% on room air, she is not tachycardic blood pressure is stable.  Potassium is 3.2 her creatinine is at baseline of 1.6, there was a delta difference of 21 and her cardiac biomarkers from 10-31, chest x-ray shows no acute findings.   Review of Systems: All systems reviewed and apart from history of presenting illness, are negative.  Past Medical History:  Diagnosis Date  . Hypertension    Past Surgical History:  Procedure Laterality Date  . IRRIGATION AND DEBRIDEMENT ABDOMEN N/A 09/15/2015   Procedure: IRRIGATION AND DEBRIDEMENT ABDOMEN;  Surgeon: Harriette Bouillon, MD;  Location: [facility] OR;  Service: General;  Laterality: N/A;   Social History:  reports that she has never smoked. She has never used smokeless tobacco. She reports current alcohol use of about 1.0 standard drink of alcohol per week. She reports that she does not use drugs.   No Known Allergies  Family History  Problem Relation Age of Onset  . Pulmonary embolism Mother   . Prostate cancer  Father      Prior to Admission medications   Medication Sig Start Date End Date Taking? Authorizing Provider  aspirin 81 MG tablet Take 81 mg by mouth daily.   Yes [provider]  diclofenac Sodium (VOLTAREN) 1 % GEL Apply 4 g topically 4 (four) times daily as needed for pain. 12/19/19  Yes [provider]  metoprolol tartrate (LOPRESSOR) 100 MG tablet Take 100 mg by mouth 2 (two) times daily. 08/12/19  Yes [provider]  olmesartan-hydrochlorothiazide (BENICAR HCT) 40-25 MG tablet Take 1 tablet by mouth daily. 08/28/19  Yes [provider]  pantoprazole (PROTONIX) 40 MG tablet Take 40 mg by mouth daily. 08/12/19  Yes [provider]  metoprolol succinate (TOPROL-XL) 25 MG 24 hr tablet Take 1 tablet (25 mg total) by mouth daily. Patient not taking: Reported on 08/31/2019 09/21/15   Renae Fickle, MD   Physical Exam: Vitals:   12/29/19 2344 12/30/19 0342 12/30/19 0828  BP: (!) 148/81 (!) 152/86 (!) 149/85  Pulse: 68 (!) 54 (!) 52  Resp: 18 18 16   Temp: 98.5 F (36.9 C)    TempSrc: Oral  Oral  SpO2: 97% 99% 100%  Weight: 124.3 kg    Height: 5\' 6"  (1.676 m)       General exam: Moderately built and nourished patient, lying comfortably supine on the gurney in no obvious distress.  Head, eyes and ENT: Nontraumatic and normocephalic. Pupils equally reacting to light and accommodation. Oral mucosa moist.  Neck: Supple. No JVD, carotid bruit  or thyromegaly.  Lymphatics: No lymphadenopathy.  Respiratory system: Clear to auscultation. No increased work of breathing.  Cardiovascular system: S1 and S2 heard, RRR. No JVD, murmurs, gallops, clicks or pedal edema.  Gastrointestinal system: Abdomen is nondistended, soft and nontender. Normal bowel sounds heard. No organomegaly or masses appreciated.  Extremities: Symmetric 5 x 5 power. Peripheral pulses symmetrically felt.   Skin: No rashes or acute findings.  Musculoskeletal system: Negative  exam.  Psychiatry: Pleasant and cooperative.   Labs on Admission:  Basic Metabolic Panel: Recent Labs  Lab 12/30/19 0009  NA 139  K 3.2*  CL 103  CO2 23  GLUCOSE 114*  BUN 35*  CREATININE 1.63*  CALCIUM 8.9   Liver Function Tests: No results for input(s): AST, ALT, ALKPHOS, BILITOT, PROT, ALBUMIN in the last 168 hours. No results for input(s): LIPASE, AMYLASE in the last 168 hours. No results for input(s): AMMONIA in the last 168 hours. CBC: Recent Labs  Lab 12/30/19 0009  WBC 5.9  HGB 12.0  HCT 36.2  MCV 83.4  PLT 257   Cardiac Enzymes: No results for input(s): CKTOTAL, CKMB, CKMBINDEX, TROPONINI in the last 168 hours.  BNP (last 3 results) No results for input(s): PROBNP in the last 8760 hours. CBG: No results for input(s): GLUCAP in the last 168 hours.  Radiological Exams on Admission: DG Chest 2 View  Result Date: 12/30/2019 CLINICAL DATA:  Chest pain. EXAM: CHEST - 2 VIEW COMPARISON:  08/30/2019 FINDINGS: Slightly improved but persistent low lung volumes from prior exam.The cardiomediastinal contours are normal. Improved streaky bibasilar opacities from prior exam. Pulmonary vasculature is normal. No consolidation, pleural effusion, or pneumothorax. No acute osseous abnormalities are seen. IMPRESSION: Low lung volumes without acute abnormality. Electronically Signed   By: Narda Rutherford M.D.   On: 12/30/2019 00:07    EKG: Independently reviewed.  Lead EKG shows sinus tachycardia, left axis deviation, preserved interval nonspecific T wave changes.  Assessment/Plan Chest discomfort and palpitations: A 69 year old female with past medical history of hypertension, her mom died of a pulmonary embolism, she recently came from [facility] to [place] without stopping. In the differential include PE, acute coronary syndrome cardiac biomarkers have basically doubled, but have remained relatively low less than 50, her twelve-lead EKG looks unremarkable.  We  will check a D-dimer, continue cycle cardiac enzymes monitor on telemetry to rule out any arrhythmias and get a 2D echo. As likely causes her to change her diet and was consuming alcohol at [facility] will be GERD will increase her Protonix to twice a day she did get a GI cocktail in the ED and it did improve her symptoms.  CKD (chronic kidney disease), stage III At baseline continue to monitor hold his ACE inhibitor in case she needs a CT angio of the chest to rule out a PE. Gentle IV fluid hydration.  Essential hypertension Hold ACE inhibitor Toradol and Norvasc.   DVT Prophylaxis: lovenox Code Status: full  Family Communication: none  Disposition Plan: observation      It is my clinical opinion that admission to observation is reasonable and necessary in this 69 y.o. female to rule out PE versus acute coronary syndrome versus GERD.   Given the aforementioned, the predictability of an adverse outcome is felt to be significant. I expect that the patient will require at least 2 midnights in the hospital to treat this condition.  Marinda Elk MD [facility]   12/30/2019, 10:17 AM

## 2019-12-31 ENCOUNTER — Observation Stay (HOSPITAL_COMMUNITY): Payer: 59

## 2019-12-31 ENCOUNTER — Other Ambulatory Visit (HOSPITAL_COMMUNITY): Payer: 59

## 2019-12-31 DIAGNOSIS — R002 Palpitations: Secondary | ICD-10-CM | POA: Diagnosis not present

## 2019-12-31 DIAGNOSIS — Z79899 Other long term (current) drug therapy: Secondary | ICD-10-CM | POA: Diagnosis not present

## 2019-12-31 DIAGNOSIS — N183 Chronic kidney disease, stage 3 unspecified: Secondary | ICD-10-CM | POA: Diagnosis not present

## 2019-12-31 DIAGNOSIS — I209 Angina pectoris, unspecified: Secondary | ICD-10-CM | POA: Diagnosis not present

## 2019-12-31 DIAGNOSIS — I129 Hypertensive chronic kidney disease with stage 1 through stage 4 chronic kidney disease, or unspecified chronic kidney disease: Secondary | ICD-10-CM | POA: Diagnosis not present

## 2019-12-31 DIAGNOSIS — Z20822 Contact with and (suspected) exposure to covid-19: Secondary | ICD-10-CM | POA: Diagnosis not present

## 2019-12-31 DIAGNOSIS — M47814 Spondylosis without myelopathy or radiculopathy, thoracic region: Secondary | ICD-10-CM | POA: Diagnosis not present

## 2019-12-31 DIAGNOSIS — R0789 Other chest pain: Secondary | ICD-10-CM | POA: Diagnosis not present

## 2019-12-31 LAB — BASIC METABOLIC PANEL
Anion gap: 6 (ref 5–15)
BUN: 18 mg/dL (ref 8–23)
CO2: 27 mmol/L (ref 22–32)
Calcium: 8.6 mg/dL — ABNORMAL LOW (ref 8.9–10.3)
Chloride: 107 mmol/L (ref 98–111)
Creatinine, Ser: 1.15 mg/dL — ABNORMAL HIGH (ref 0.44–1.00)
GFR calc Af Amer: 57 mL/min — ABNORMAL LOW (ref >60)
GFR calc non Af Amer: 50 mL/min — ABNORMAL LOW (ref >60)
Glucose, Bld: 98 mg/dL (ref 70–99)
Potassium: 4.1 mmol/L (ref 3.5–5.1)
Sodium: 140 mmol/L (ref 135–145)

## 2019-12-31 LAB — TROPONIN I (HIGH SENSITIVITY): Troponin I (High Sensitivity): 11 ng/L (ref <18)

## 2019-12-31 MED ORDER — SODIUM CHLORIDE (PF) 0.9 % IJ SOLN
INTRAMUSCULAR | Status: AC
  Filled 2019-12-31: qty 50

## 2019-12-31 MED ORDER — IOHEXOL 350 MG/ML SOLN
75.0000 mL | Freq: Once | INTRAVENOUS | Status: AC | PRN
  Administered 2019-12-31: 75 mL via INTRAVENOUS

## 2019-12-31 NOTE — ED Notes (Signed)
D/C'd to home with written and verbal instructions. Voiced understanding. Pt is A/Ox3. Skin w/d/pink. Resp wnl, equal and non-labored. Denies chest pain, dizziness or SHOB. No other complaints voiced. Declined w/c. Gait steady. IV d/c'd and intact. Bleeding controlled. Ambulated out of ED independently and safely with belongings. NAD. No complaint voiced.

## 2019-12-31 NOTE — ED Notes (Addendum)
Pt cleared to be discharged to home. Pt declined 10am meds and sts she will take her morning meds at home. This RN read list of meds and pt sts she has all meds available at home. Dr. Mahala Menghini made aware. Pt denies chest pain, SHOB, dizziness, HA, change in vision or any other symptom at this time.

## 2019-12-31 NOTE — Discharge Summary (Signed)
Physician Discharge Summary  Annette Walker KGY:[id number] DOB: 1951-02-28 DOA: 12/30/2019  PCP: [name], MD  Admit date: 12/30/2019 Discharge date: 12/31/2019  Time spent: 10  Recommendations for Outpatient Follow-up:  1. Follow up as OP with PCP and get labs 1 week  Discharge Diagnoses:  Active Problems:   Chest discomfort   CKD (chronic kidney disease), stage III   Essential hypertension   Palpitations   Chest pain   Discharge Condition: improved  Diet recommendation: reg hh  Filed Weights   12/29/19 2344  Weight: 124.3 kg    History of present illness:  69 year old African-American female HTN PACs in the past Necrotizing fasciitis on admission 09/16/2015 with emergent surgery when she grew out staph/strep pyogenes CKD 3B BMI 44 Prior microcytic anemia  She came to [facility] long ED 8/16 with palpitations after recent travel to [place] (3/4-hour drive) with discomfort upper abdomen chest  Patient received a GI cocktail in the emergency room found to have mild hypokalemia and a stable creatinine  Because troponin was elevated from 10-31 chest pain rule out was requested However D-dimer was 1.6  Chest CT neg for PE  Tele was reviewed, she had no new findings, Troponins were probably spurious as tredns didn't stay elevated  SHe didn't have CP subsequently nor palpitations and was d/c home in stable condition without any untoward effects and has been encouraged to follow-up with her primary care physician in the outpatient setting for further management  I did mention to her that her pain was probably secondary to unaccustomed alcohol use when she was at [facility] and that she should drink alcohol in the future with some food and or use her Protonix or acid reducer     Discharge Exam: Vitals:   12/31/19 0730 12/31/19 0810  BP: 140/68 (!) 144/86  Pulse: (!) 54 65  Resp: 16 (!) 25  Temp: 98.1 F (36.7 C)   SpO2: 98% 100%    General: Alert very  pleasant oriented no distress thick neck Mallampati 4 S1-S2 no murmur rub or gallop Cardiovascular: S1-S2 no murmur Respiratory: Clear no added sound No lower extremity edema no rash no edema  Discharge Instructions   Discharge Instructions    Diet - low sodium heart healthy   Complete by: As directed    Discharge instructions   Complete by: As directed    Follow with primary care in 1 - 2 weeks make sure that you take your meds as indicated   Increase activity slowly   Complete by: As directed      Allergies as of 12/31/2019   No Known Allergies     Medication List    STOP taking these medications   metoprolol succinate 25 MG 24 hr tablet Commonly known as: TOPROL-XL     TAKE these medications   aspirin 81 MG tablet Take 81 mg by mouth daily.   diclofenac Sodium 1 % Gel Commonly known as: VOLTAREN Apply 4 g topically 4 (four) times daily as needed for pain.   metoprolol tartrate 100 MG tablet Commonly known as: LOPRESSOR Take 100 mg by mouth 2 (two) times daily.   olmesartan-hydrochlorothiazide 40-25 MG tablet Commonly known as: BENICAR HCT Take 1 tablet by mouth daily.   pantoprazole 40 MG tablet Commonly known as: PROTONIX Take 40 mg by mouth daily.      No Known Allergies    The results of significant diagnostics from this hospitalization (including imaging, microbiology, ancillary and laboratory) are listed below  for reference.    Significant Diagnostic Studies: DG Chest 2 View  Result Date: 12/30/2019 CLINICAL DATA:  Chest pain. EXAM: CHEST - 2 VIEW COMPARISON:  08/30/2019 FINDINGS: Slightly improved but persistent low lung volumes from prior exam.The cardiomediastinal contours are normal. Improved streaky bibasilar opacities from prior exam. Pulmonary vasculature is normal. No consolidation, pleural effusion, or pneumothorax. No acute osseous abnormalities are seen. IMPRESSION: Low lung volumes without acute abnormality. Electronically Signed   By:  Narda Rutherford M.D.   On: 12/30/2019 00:07   CT ANGIO CHEST PE W OR WO CONTRAST  Result Date: 12/31/2019 CLINICAL DATA:  Palpitations and chest discomfort, recent extended car ride EXAM: CT ANGIOGRAPHY CHEST WITH CONTRAST TECHNIQUE: Multidetector CT imaging of the chest was performed using the standard protocol during bolus administration of intravenous contrast. Multiplanar CT image reconstructions and MIPs were obtained to evaluate the vascular anatomy. CONTRAST:  67mL OMNIPAQUE IOHEXOL 350 MG/ML SOLN COMPARISON:  None. FINDINGS: Cardiovascular: There is motion artifact. Satisfactory opacification of the pulmonary arteries to the proximal segmental level. No evidence of pulmonary embolism. Main pulmonary artery measures up to 4 cm. Normal heart size. No pericardial effusion. Mediastinum/Nodes: There are no enlarged lymph nodes identified. The thyroid is unremarkable. Esophagus is unremarkable. Lungs/Pleura: Imaging in expiration. There is patchy bilateral density probably reflecting atelectasis. No pleural effusion or pneumothorax. Upper Abdomen: No acute abnormality. Musculoskeletal: Multilevel degenerative changes of the thoracic spine. No acute osseous abnormality. Review of the MIP images confirms the above findings. IMPRESSION: No evidence of acute pulmonary embolism or other acute abnormality. Enlarged of the main pulmonary artery suggesting pulmonary arterial hypertension. Electronically Signed   By: Guadlupe Spanish M.D.   On: 12/31/2019 08:45    Microbiology: Recent Results (from the past 240 hour(s))  SARS Coronavirus 2 by RT PCR (hospital order, performed in [facility] hospital lab) Nasopharyngeal Nasopharyngeal Swab     Status: None   Collection Time: 12/30/19 11:37 AM   Specimen: Nasopharyngeal Swab  Result Value Ref Range Status   SARS Coronavirus 2 NEGATIVE NEGATIVE Final    Comment: (NOTE) SARS-CoV-2 target nucleic acids are NOT DETECTED.  The SARS-CoV-2 RNA is generally  detectable in upper and lower respiratory specimens during the acute phase of infection. The lowest concentration of SARS-CoV-2 viral copies this assay can detect is 250 copies / mL. A negative result does not preclude SARS-CoV-2 infection and should not be used as the sole basis for treatment or other patient management decisions.  A negative result may occur with improper specimen collection / handling, submission of specimen other than nasopharyngeal swab, presence of viral mutation(s) within the areas targeted by this assay, and inadequate number of viral copies (<250 copies / mL). A negative result must be combined with clinical observations, patient history, and epidemiological information.  Fact Sheet for Patients:   BoilerBrush.com.cy  Fact Sheet for Healthcare Providers: https://pope.com/  This test is not yet approved or  cleared by the [place] FDA and has been authorized for detection and/or diagnosis of SARS-CoV-2 by FDA under an Emergency Use Authorization (EUA).  This EUA will remain in effect (meaning this test can be used) for the duration of the COVID-19 declaration under Section 564(b)(1) of the Act, 21 U.S.C. section 360bbb-3(b)(1), unless the authorization is terminated or revoked sooner.  Performed at [facility], [address] [place], [place] [address]      Labs: Basic Metabolic Panel: Recent Labs  Lab 12/30/19 0009 12/31/19 0807  NA 139 140  K 3.2* 4.1  CL 103 107  CO2 23 27  GLUCOSE 114* 98  BUN 35* 18  CREATININE 1.63* 1.15*  CALCIUM 8.9 8.6*   Liver Function Tests: No results for input(s): AST, ALT, ALKPHOS, BILITOT, PROT, ALBUMIN in the last 168 hours. No results for input(s): LIPASE, AMYLASE in the last 168 hours. No results for input(s): AMMONIA in the last 168 hours. CBC: Recent Labs  Lab 12/30/19 0009  WBC 5.9  HGB 12.0  HCT 36.2  MCV 83.4  PLT 257    Cardiac Enzymes: No results for input(s): CKTOTAL, CKMB, CKMBINDEX, TROPONINI in the last 168 hours. BNP: BNP (last 3 results) No results for input(s): BNP in the last 8760 hours.  ProBNP (last 3 results) No results for input(s): PROBNP in the last 8760 hours.  CBG: No results for input(s): GLUCAP in the last 168 hours.     Signed:  Rhetta Mura MD   [facility] 12/31/2019, 9:10 AM

## 2020-02-07 ENCOUNTER — Other Ambulatory Visit (HOSPITAL_COMMUNITY): Payer: Self-pay | Admitting: Internal Medicine

## 2020-02-14 ENCOUNTER — Other Ambulatory Visit (HOSPITAL_COMMUNITY): Payer: Self-pay | Admitting: Internal Medicine

## 2020-02-14 DIAGNOSIS — I10 Essential (primary) hypertension: Secondary | ICD-10-CM | POA: Diagnosis not present

## 2020-02-14 DIAGNOSIS — K219 Gastro-esophageal reflux disease without esophagitis: Secondary | ICD-10-CM | POA: Diagnosis not present

## 2020-03-24 ENCOUNTER — Other Ambulatory Visit: Payer: 59

## 2020-04-07 DIAGNOSIS — I10 Essential (primary) hypertension: Secondary | ICD-10-CM | POA: Diagnosis not present

## 2020-04-07 DIAGNOSIS — K219 Gastro-esophageal reflux disease without esophagitis: Secondary | ICD-10-CM | POA: Diagnosis not present

## 2020-07-09 ENCOUNTER — Other Ambulatory Visit: Payer: Self-pay | Admitting: Internal Medicine

## 2020-07-09 DIAGNOSIS — Z0000 Encounter for general adult medical examination without abnormal findings: Secondary | ICD-10-CM | POA: Diagnosis not present

## 2020-07-09 DIAGNOSIS — I10 Essential (primary) hypertension: Secondary | ICD-10-CM | POA: Diagnosis not present

## 2020-07-09 DIAGNOSIS — Z13220 Encounter for screening for lipoid disorders: Secondary | ICD-10-CM | POA: Diagnosis not present

## 2020-07-09 DIAGNOSIS — Z131 Encounter for screening for diabetes mellitus: Secondary | ICD-10-CM | POA: Diagnosis not present

## 2020-07-09 DIAGNOSIS — Z1211 Encounter for screening for malignant neoplasm of colon: Secondary | ICD-10-CM | POA: Diagnosis not present

## 2020-07-09 DIAGNOSIS — E559 Vitamin D deficiency, unspecified: Secondary | ICD-10-CM | POA: Diagnosis not present

## 2020-07-10 LAB — CBC
HCT: 36.3 % (ref 35.0–45.0)
Hemoglobin: 11.8 g/dL (ref 11.7–15.5)
MCH: 27 pg (ref 27.0–33.0)
MCHC: 32.5 g/dL (ref 32.0–36.0)
MCV: 83.1 fL (ref 80.0–100.0)
MPV: 12.1 fL (ref 7.5–12.5)
Platelets: 229 10*3/uL (ref 140–400)
RBC: 4.37 10*6/uL (ref 3.80–5.10)
RDW: 14 % (ref 11.0–15.0)
WBC: 5.3 10*3/uL (ref 3.8–10.8)

## 2020-07-10 LAB — COMPLETE METABOLIC PANEL WITH GFR
AG Ratio: 1 (calc) (ref 1.0–2.5)
ALT: 11 U/L (ref 6–29)
AST: 15 U/L (ref 10–35)
Albumin: 3.9 g/dL (ref 3.6–5.1)
Alkaline phosphatase (APISO): 80 U/L (ref 37–153)
BUN/Creatinine Ratio: 22 (calc) (ref 6–22)
BUN: 32 mg/dL — ABNORMAL HIGH (ref 7–25)
CO2: 25 mmol/L (ref 20–32)
Calcium: 9.5 mg/dL (ref 8.6–10.4)
Chloride: 107 mmol/L (ref 98–110)
Creat: 1.46 mg/dL — ABNORMAL HIGH (ref 0.50–0.99)
GFR, Est African American: 43 mL/min/{1.73_m2} — ABNORMAL LOW (ref >=60)
GFR, Est Non African American: 37 mL/min/{1.73_m2} — ABNORMAL LOW (ref >=60)
Globulin: 3.9 g/dL (calc) — ABNORMAL HIGH (ref 1.9–3.7)
Glucose, Bld: 88 mg/dL (ref 65–99)
Potassium: 4 mmol/L (ref 3.5–5.3)
Sodium: 142 mmol/L (ref 135–146)
Total Bilirubin: 0.5 mg/dL (ref 0.2–1.2)
Total Protein: 7.8 g/dL (ref 6.1–8.1)

## 2020-07-10 LAB — LIPID PANEL
Cholesterol: 156 mg/dL (ref <200)
HDL: 42 mg/dL — ABNORMAL LOW (ref >=50)
LDL Cholesterol (Calc): 95 mg/dL (calc)
Non-HDL Cholesterol (Calc): 114 mg/dL (calc) (ref <130)
Total CHOL/HDL Ratio: 3.7 (calc) (ref <5.0)
Triglycerides: 94 mg/dL (ref <150)

## 2020-07-10 LAB — TSH: TSH: 2.74 mIU/L (ref 0.40–4.50)

## 2020-07-10 LAB — VITAMIN D 25 HYDROXY (VIT D DEFICIENCY, FRACTURES): Vit D, 25-Hydroxy: 24 ng/mL — ABNORMAL LOW (ref 30–100)

## 2020-09-18 ENCOUNTER — Other Ambulatory Visit (HOSPITAL_COMMUNITY): Payer: Self-pay

## 2020-09-18 MED FILL — Olmesartan Medoxomil-Hydrochlorothiazide Tab 40-25 MG: ORAL | 90 days supply | Qty: 90 | Fill #0 | Status: AC

## 2020-09-18 MED FILL — Pantoprazole Sodium EC Tab 40 MG (Base Equiv): ORAL | 90 days supply | Qty: 90 | Fill #0 | Status: AC

## 2020-09-18 MED FILL — Metoprolol Tartrate Tab 100 MG: ORAL | 90 days supply | Qty: 180 | Fill #0 | Status: AC

## 2020-09-19 ENCOUNTER — Other Ambulatory Visit: Payer: Self-pay

## 2020-12-23 ENCOUNTER — Other Ambulatory Visit (HOSPITAL_COMMUNITY): Payer: Self-pay

## 2020-12-23 MED ORDER — METOPROLOL TARTRATE 100 MG PO TABS
100.0000 mg | ORAL_TABLET | Freq: Two times a day (BID) | ORAL | 2 refills | Status: DC
  Filled 2020-12-23: qty 180, 90d supply, fill #0
  Filled 2021-04-06: qty 180, 90d supply, fill #1
  Filled 2021-07-14 – 2021-07-22 (×2): qty 180, 90d supply, fill #2

## 2020-12-23 MED ORDER — OLMESARTAN MEDOXOMIL-HCTZ 40-25 MG PO TABS
1.0000 | ORAL_TABLET | Freq: Every day | ORAL | 2 refills | Status: AC
  Filled 2020-12-23: qty 90, 90d supply, fill #0

## 2020-12-23 MED ORDER — PANTOPRAZOLE SODIUM 40 MG PO TBEC
40.0000 mg | DELAYED_RELEASE_TABLET | Freq: Every day | ORAL | 2 refills | Status: DC
  Filled 2020-12-23: qty 90, 90d supply, fill #0
  Filled 2021-03-26: qty 90, 90d supply, fill #1
  Filled 2021-06-23: qty 90, 90d supply, fill #2

## 2020-12-26 ENCOUNTER — Other Ambulatory Visit (HOSPITAL_COMMUNITY): Payer: Self-pay

## 2020-12-28 ENCOUNTER — Other Ambulatory Visit (HOSPITAL_COMMUNITY): Payer: Self-pay

## 2020-12-28 MED ORDER — METOPROLOL TARTRATE 100 MG PO TABS
ORAL_TABLET | ORAL | 2 refills | Status: AC
  Filled 2020-12-28: qty 180, 90d supply, fill #0

## 2020-12-28 MED ORDER — OLMESARTAN MEDOXOMIL-HCTZ 40-25 MG PO TABS
1.0000 | ORAL_TABLET | Freq: Every day | ORAL | 2 refills | Status: DC
  Filled 2021-03-24: qty 90, 90d supply, fill #0
  Filled 2021-06-23: qty 90, 90d supply, fill #1
  Filled 2021-09-21: qty 90, 90d supply, fill #2

## 2021-01-05 IMAGING — CR DG CHEST 2V
2 series · 2 of 2 positions shown · non-contrast
Comparison: 08/30/2019

CLINICAL DATA: Chest pain.

EXAM:
CHEST - 2 VIEW

[w chest pa]
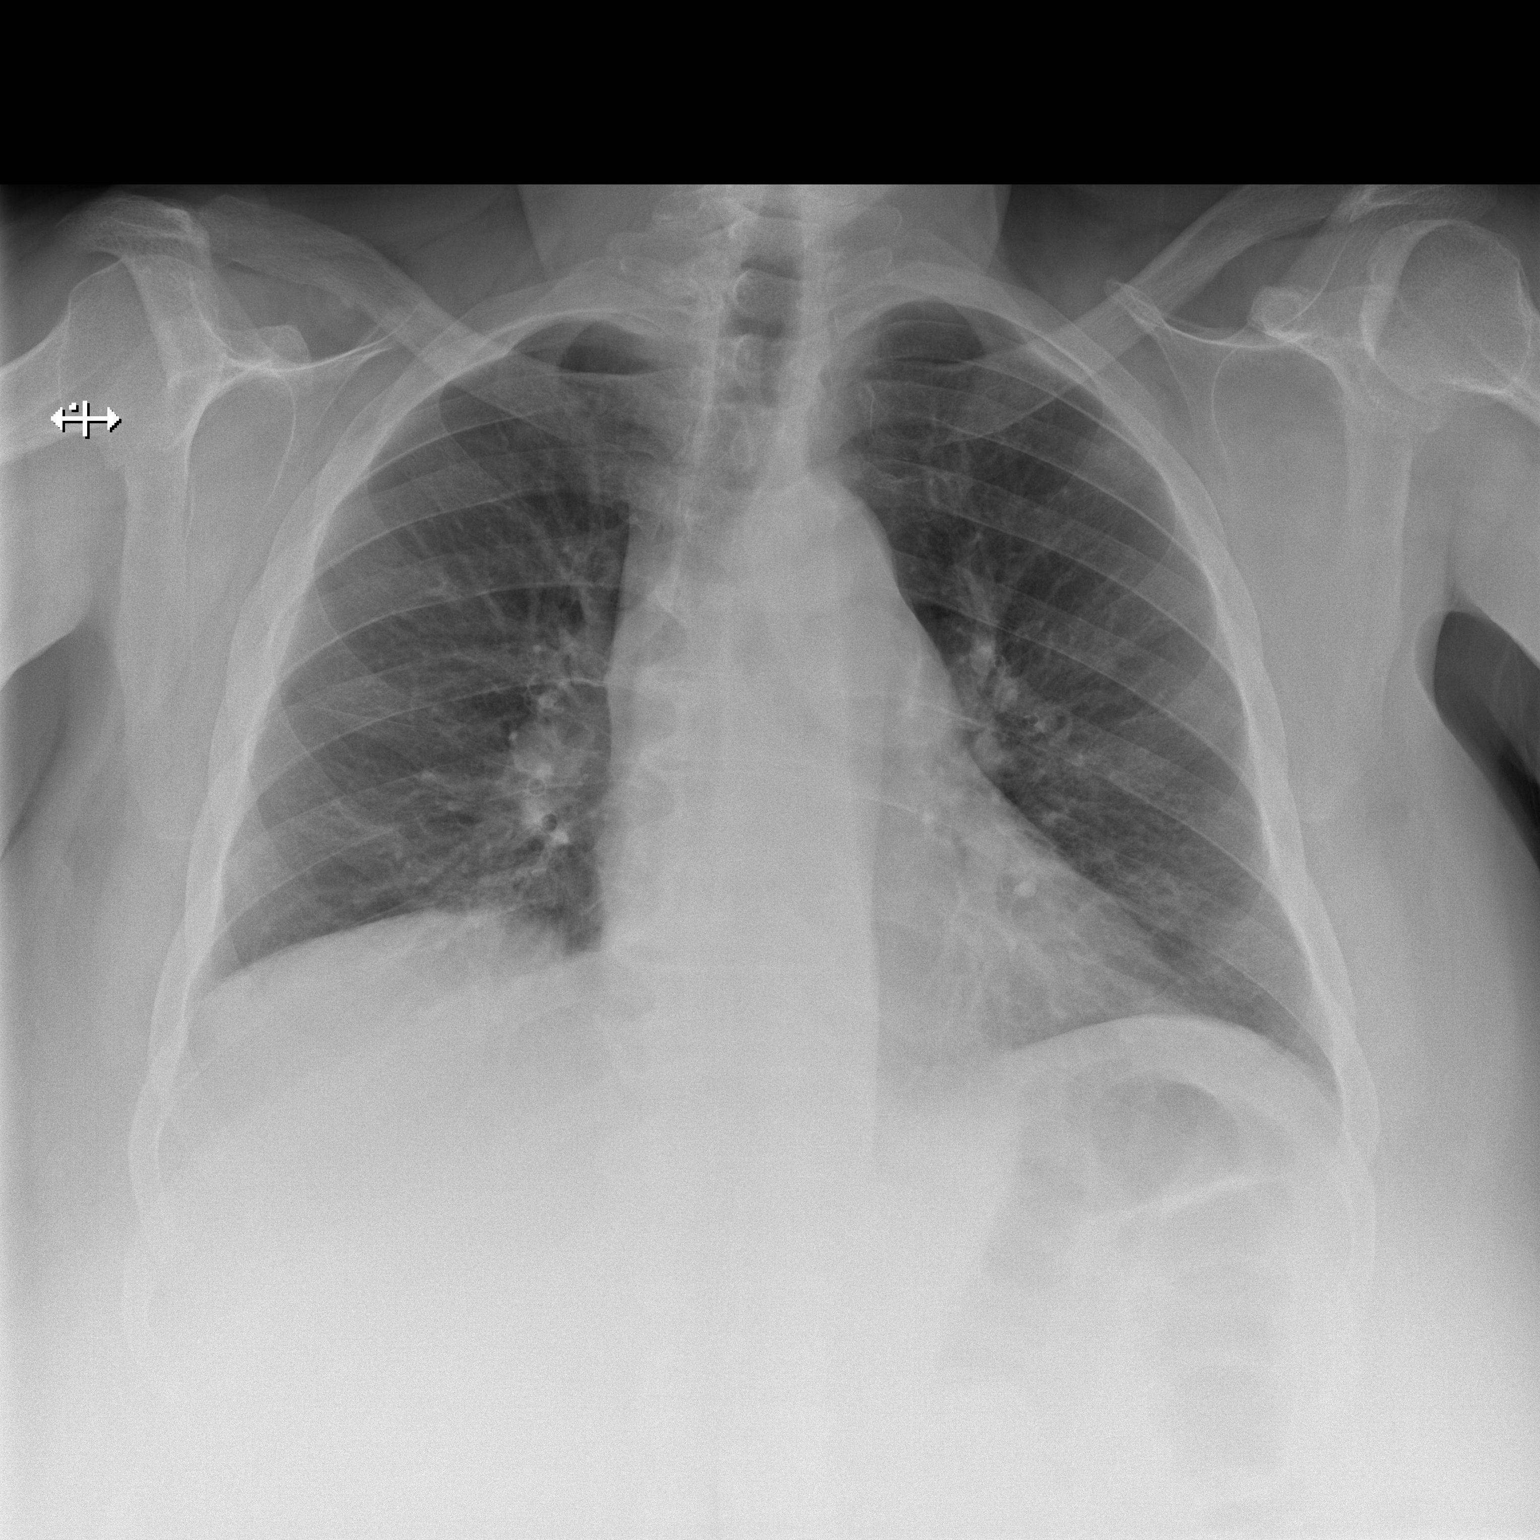

[w chest lat]
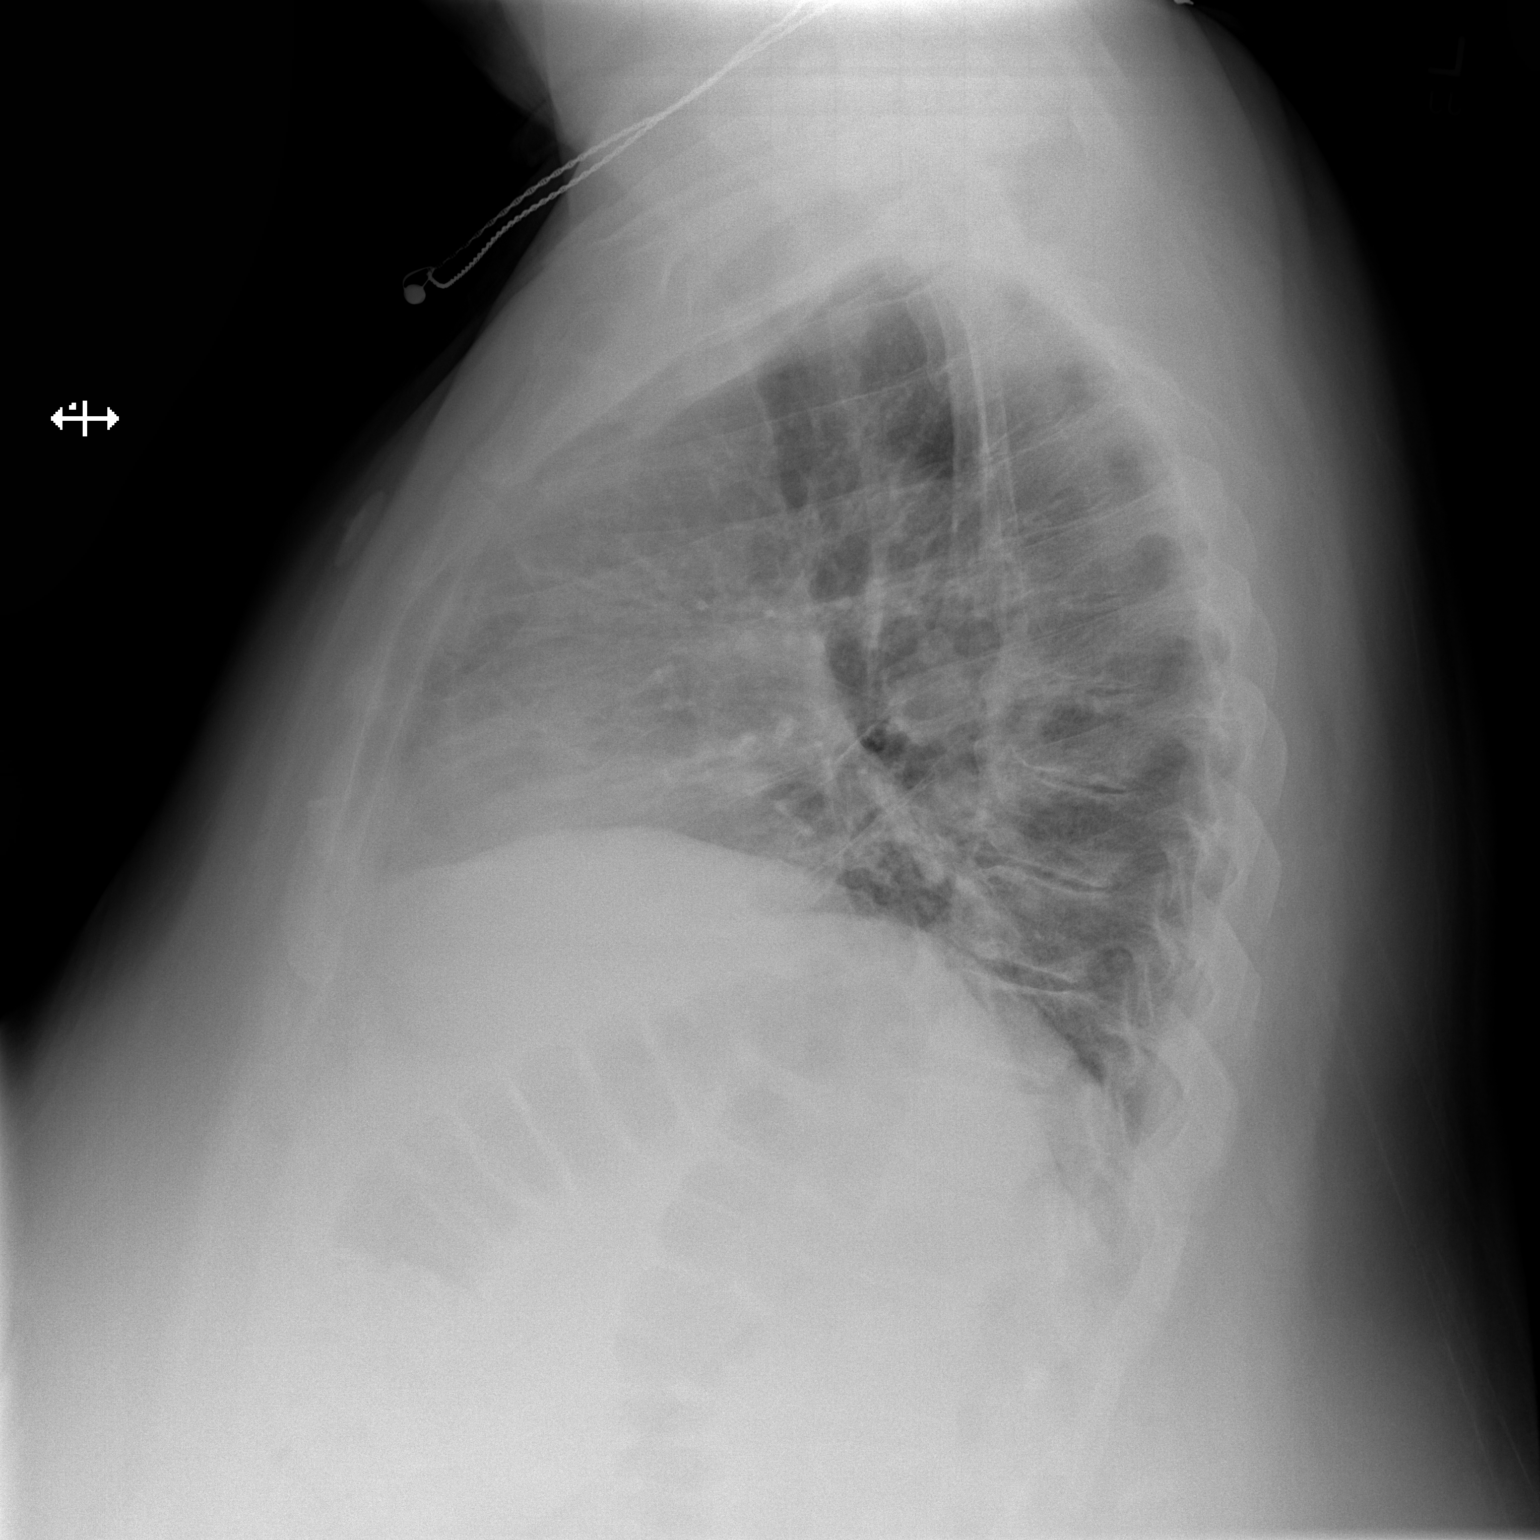

[2 of 2 positions shown; findings below may reference images not displayed]

FINDINGS: Slightly improved but persistent low lung volumes from prior
exam.The cardiomediastinal contours are normal. Improved streaky
bibasilar opacities from prior exam. Pulmonary vasculature is
normal. No consolidation, pleural effusion, or pneumothorax. No
acute osseous abnormalities are seen.
IMPRESSION: Low lung volumes without acute abnormality.

## 2021-01-06 IMAGING — CT CT ANGIO CHEST
2 of 6 series · 18 of 36 positions shown · IV contrast (omnipaque)
Comparison: None.

CLINICAL DATA: Palpitations and chest discomfort, recent extended
car ride

EXAM:
CT ANGIOGRAPHY CHEST WITH CONTRAST
TECHNIQUE: Multidetector CT imaging of the chest was performed using the
standard protocol during bolus administration of intravenous
contrast. Multiplanar CT image reconstructions and MIPs were
obtained to evaluate the vascular anatomy.
CONTRAST:  75mL OMNIPAQUE IOHEXOL 350 MG/ML SOLN

[Series 5: thins · axial · 0.71mm/px · z∈[-72,+114]mm · 17 of 210 slices shown]
[im 12/210  lung]
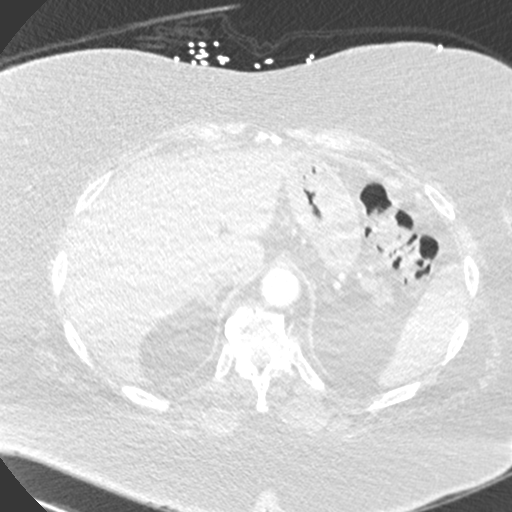
[im 24/210  mediastinal]
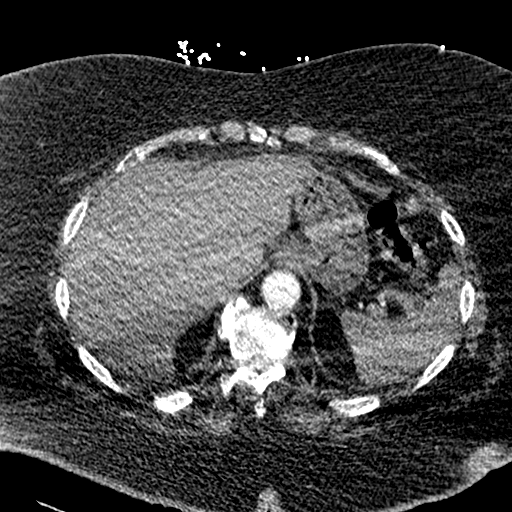
[im 35/210  lung]
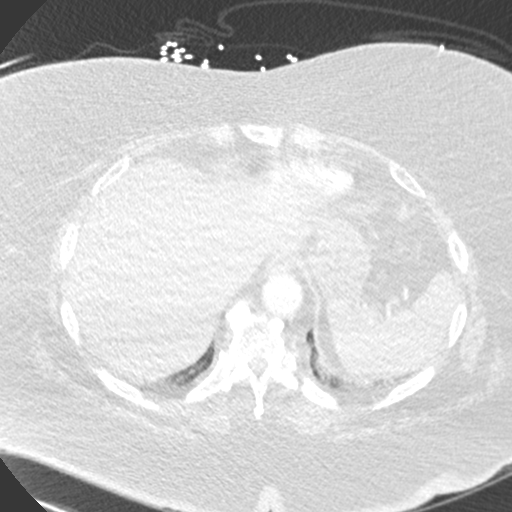
[im 47/210  mediastinal]
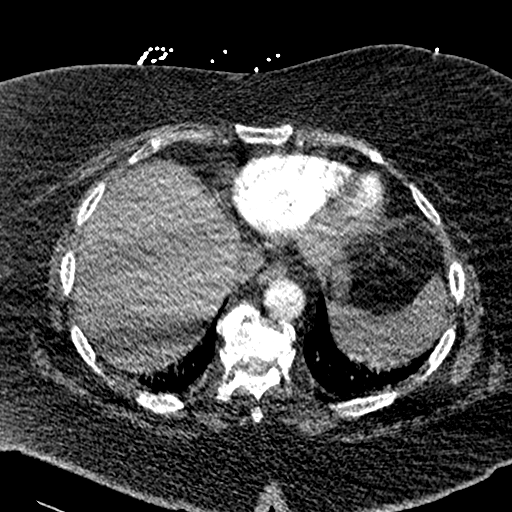
[im 59/210  lung]
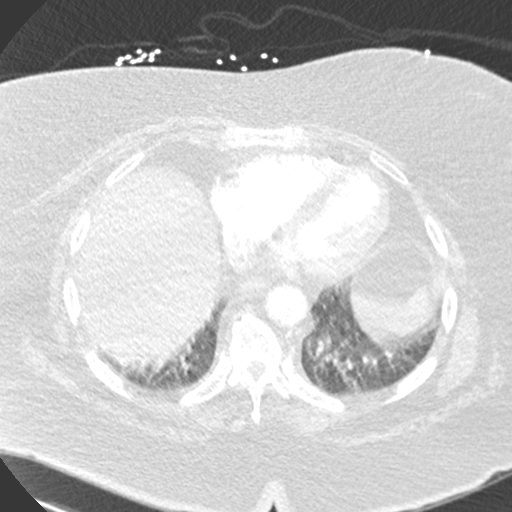
[im 70/210  mediastinal]
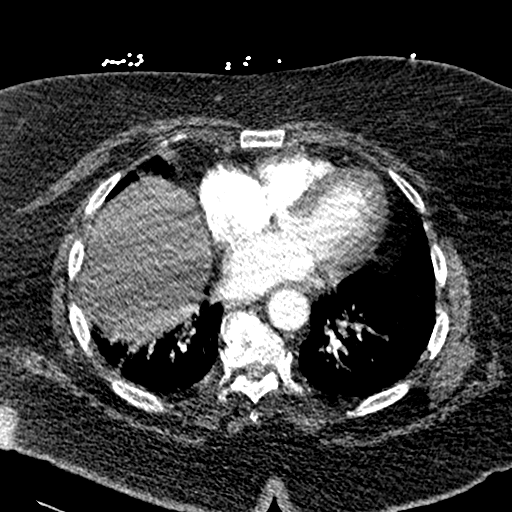
[im 82/210  lung]
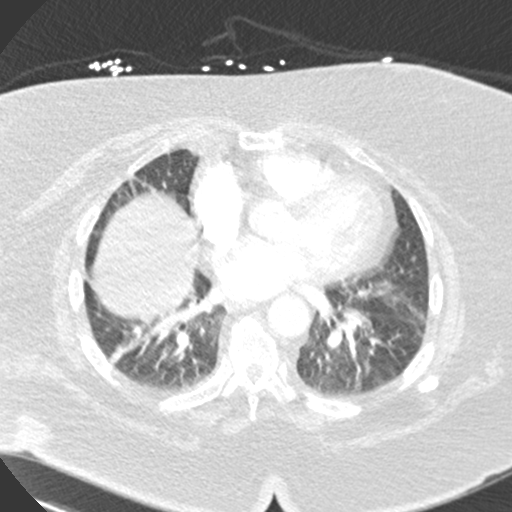
[im 93/210  mediastinal]
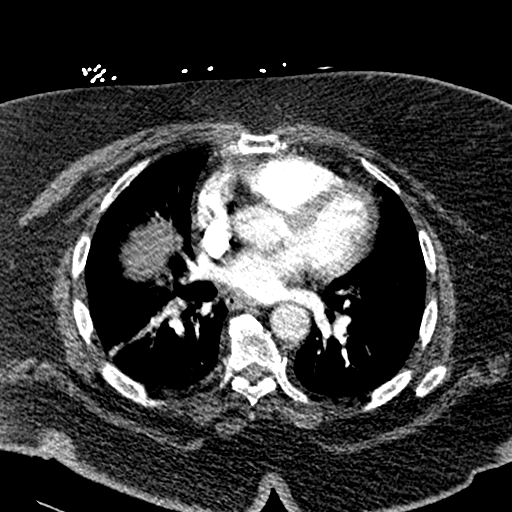
[im 105/210  lung]
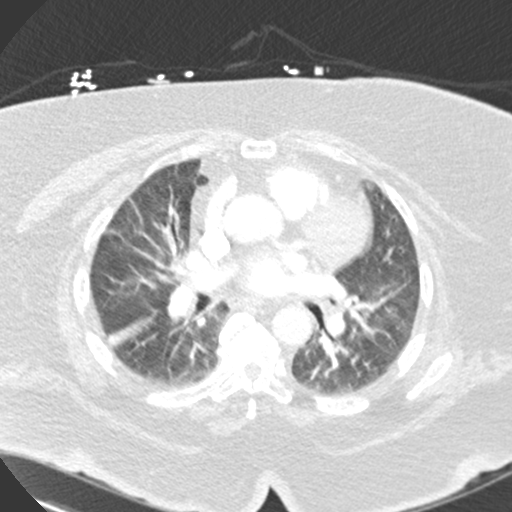
[im 117/210  mediastinal]
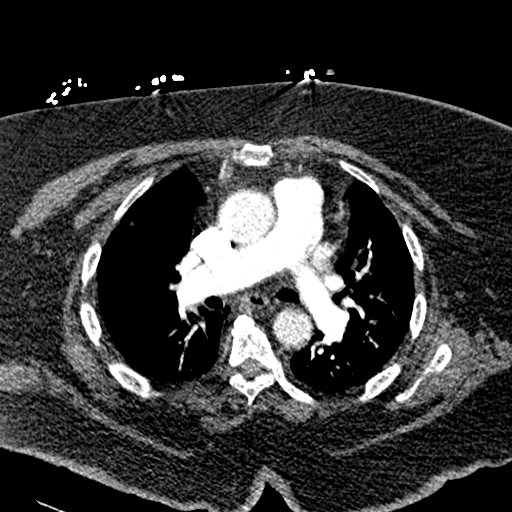
[im 128/210  lung]
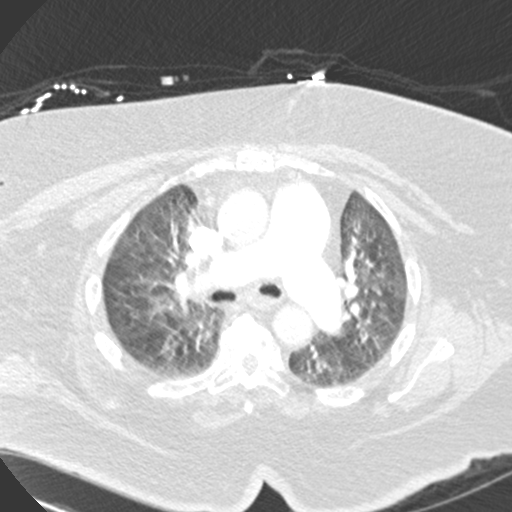
[im 140/210  mediastinal]
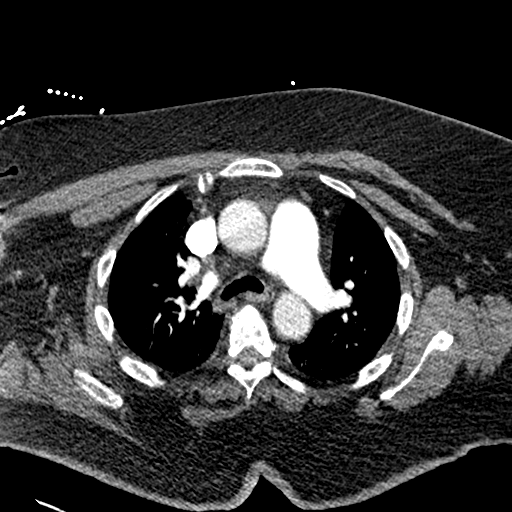
[im 151/210  lung]
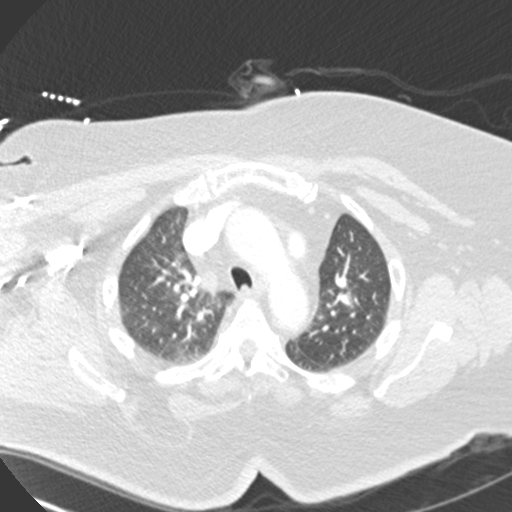
[im 163/210  mediastinal]
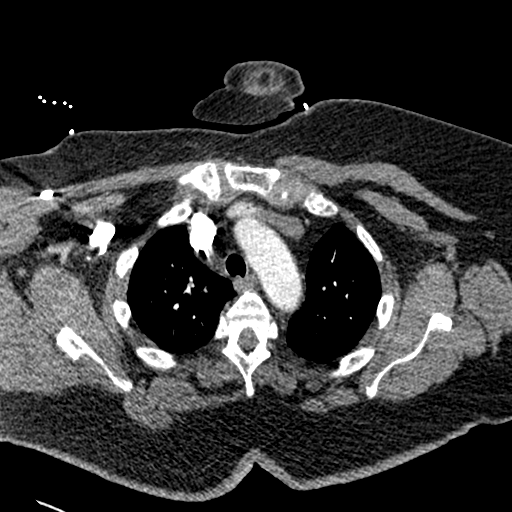
[im 175/210  lung]
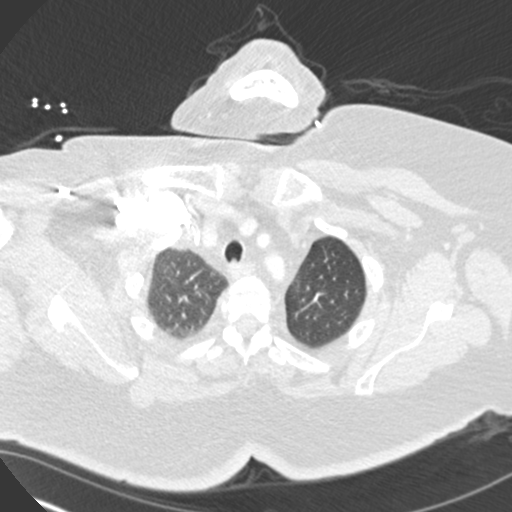
[im 186/210  mediastinal]
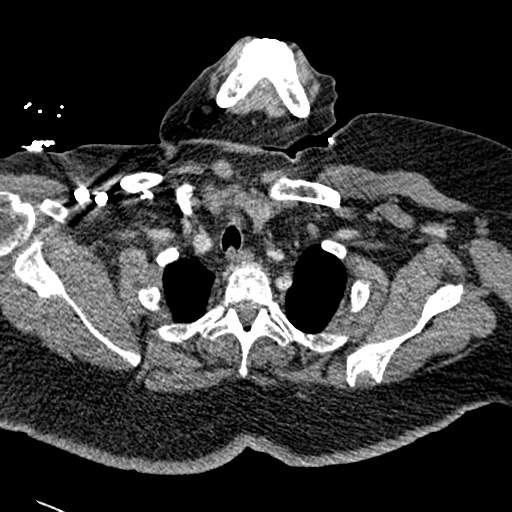
[im 198/210  lung]
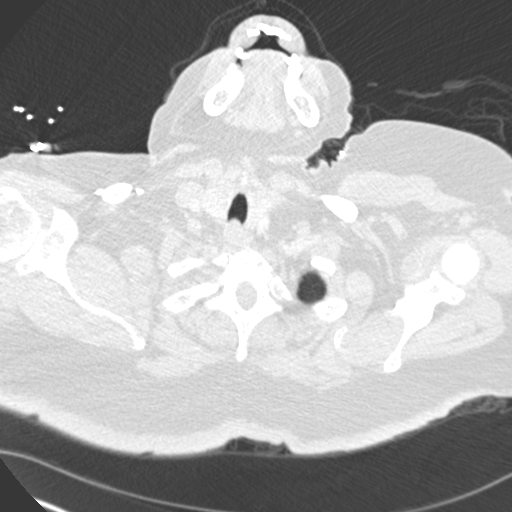

[Series 7: coronal mpr · coronal · 0.50mm/px · 1 of 175 slices shown]
[im 88/175  mediastinal]
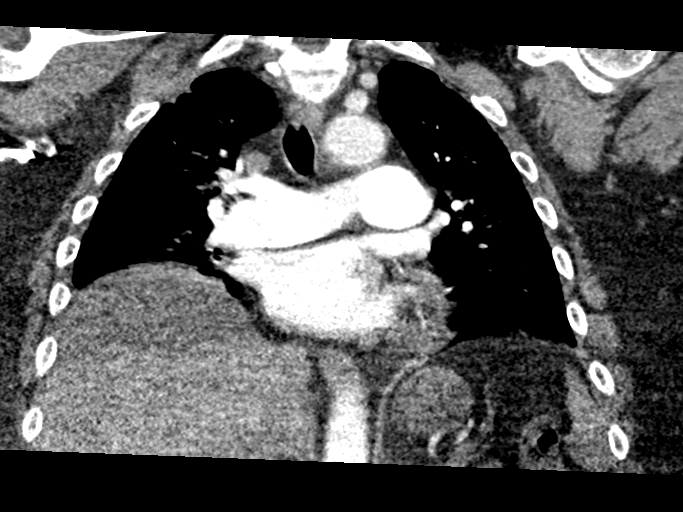

[18 of 36 positions shown; findings below may reference images not displayed]

FINDINGS: Cardiovascular: There is motion artifact. Satisfactory opacification
of the pulmonary arteries to the proximal segmental level. No
evidence of pulmonary embolism. Main pulmonary artery measures up to
4 cm. Normal heart size. No pericardial effusion.

Mediastinum/Nodes: There are no enlarged lymph nodes identified. The
thyroid is unremarkable. Esophagus is unremarkable.

Lungs/Pleura: Imaging in expiration. There is patchy bilateral
density probably reflecting atelectasis. No pleural effusion or
pneumothorax.

Upper Abdomen: No acute abnormality.

Musculoskeletal: Multilevel degenerative changes of the thoracic
spine. No acute osseous abnormality.

Review of the MIP images confirms the above findings.
IMPRESSION: No evidence of acute pulmonary embolism or other acute abnormality.

Enlarged of the main pulmonary artery suggesting pulmonary arterial
hypertension.

## 2021-02-25 DIAGNOSIS — K219 Gastro-esophageal reflux disease without esophagitis: Secondary | ICD-10-CM | POA: Diagnosis not present

## 2021-02-25 DIAGNOSIS — I10 Essential (primary) hypertension: Secondary | ICD-10-CM | POA: Diagnosis not present

## 2021-02-25 DIAGNOSIS — F430 Acute stress reaction: Secondary | ICD-10-CM | POA: Diagnosis not present

## 2021-02-25 DIAGNOSIS — N182 Chronic kidney disease, stage 2 (mild): Secondary | ICD-10-CM | POA: Diagnosis not present

## 2021-03-24 ENCOUNTER — Other Ambulatory Visit (HOSPITAL_COMMUNITY): Payer: Self-pay

## 2021-03-26 ENCOUNTER — Other Ambulatory Visit (HOSPITAL_COMMUNITY): Payer: Self-pay

## 2021-04-06 ENCOUNTER — Other Ambulatory Visit (HOSPITAL_COMMUNITY): Payer: Self-pay

## 2021-06-24 ENCOUNTER — Other Ambulatory Visit (HOSPITAL_COMMUNITY): Payer: Self-pay

## 2021-07-14 ENCOUNTER — Other Ambulatory Visit (HOSPITAL_COMMUNITY): Payer: Self-pay

## 2021-07-22 ENCOUNTER — Other Ambulatory Visit (HOSPITAL_COMMUNITY): Payer: Self-pay

## 2021-07-23 ENCOUNTER — Other Ambulatory Visit (HOSPITAL_COMMUNITY): Payer: Self-pay

## 2021-09-21 ENCOUNTER — Other Ambulatory Visit (HOSPITAL_COMMUNITY): Payer: Self-pay

## 2021-09-21 MED ORDER — PANTOPRAZOLE SODIUM 40 MG PO TBEC
DELAYED_RELEASE_TABLET | ORAL | 1 refills | Status: DC
  Filled 2021-09-21: qty 90, 90d supply, fill #0
  Filled 2021-12-21: qty 90, 90d supply, fill #1

## 2021-10-29 ENCOUNTER — Other Ambulatory Visit (HOSPITAL_COMMUNITY): Payer: Self-pay

## 2021-10-29 MED ORDER — METOPROLOL TARTRATE 100 MG PO TABS
ORAL_TABLET | ORAL | 2 refills | Status: DC
  Filled 2021-10-29: qty 180, 90d supply, fill #0
  Filled 2022-02-15: qty 180, 90d supply, fill #1

## 2021-12-09 ENCOUNTER — Other Ambulatory Visit (HOSPITAL_COMMUNITY): Payer: Self-pay

## 2021-12-09 ENCOUNTER — Other Ambulatory Visit: Payer: Self-pay | Admitting: Internal Medicine

## 2021-12-09 DIAGNOSIS — Z1239 Encounter for other screening for malignant neoplasm of breast: Secondary | ICD-10-CM | POA: Diagnosis not present

## 2021-12-09 DIAGNOSIS — G4700 Insomnia, unspecified: Secondary | ICD-10-CM | POA: Diagnosis not present

## 2021-12-09 DIAGNOSIS — Z13220 Encounter for screening for lipoid disorders: Secondary | ICD-10-CM | POA: Diagnosis not present

## 2021-12-09 DIAGNOSIS — Z131 Encounter for screening for diabetes mellitus: Secondary | ICD-10-CM | POA: Diagnosis not present

## 2021-12-09 DIAGNOSIS — I10 Essential (primary) hypertension: Secondary | ICD-10-CM | POA: Diagnosis not present

## 2021-12-09 DIAGNOSIS — E559 Vitamin D deficiency, unspecified: Secondary | ICD-10-CM | POA: Diagnosis not present

## 2021-12-09 DIAGNOSIS — Z0000 Encounter for general adult medical examination without abnormal findings: Secondary | ICD-10-CM | POA: Diagnosis not present

## 2021-12-09 MED ORDER — HYDROXYZINE HCL 25 MG PO TABS
ORAL_TABLET | ORAL | 2 refills | Status: AC
  Filled 2021-12-09: qty 30, 30d supply, fill #0

## 2021-12-10 ENCOUNTER — Other Ambulatory Visit: Payer: Self-pay | Admitting: Internal Medicine

## 2021-12-10 DIAGNOSIS — Z1239 Encounter for other screening for malignant neoplasm of breast: Secondary | ICD-10-CM

## 2021-12-10 LAB — COMPLETE METABOLIC PANEL WITH GFR
AG Ratio: 1.1 (calc) (ref 1.0–2.5)
ALT: 11 U/L (ref 6–29)
AST: 17 U/L (ref 10–35)
Albumin: 4.1 g/dL (ref 3.6–5.1)
Alkaline phosphatase (APISO): 81 U/L (ref 37–153)
BUN/Creatinine Ratio: 22 (calc) (ref 6–22)
BUN: 33 mg/dL — ABNORMAL HIGH (ref 7–25)
CO2: 22 mmol/L (ref 20–32)
Calcium: 9.3 mg/dL (ref 8.6–10.4)
Chloride: 106 mmol/L (ref 98–110)
Creat: 1.49 mg/dL — ABNORMAL HIGH (ref 0.50–1.05)
Globulin: 3.9 g/dL (calc) — ABNORMAL HIGH (ref 1.9–3.7)
Glucose, Bld: 87 mg/dL (ref 65–99)
Potassium: 3.9 mmol/L (ref 3.5–5.3)
Sodium: 140 mmol/L (ref 135–146)
Total Bilirubin: 0.5 mg/dL (ref 0.2–1.2)
Total Protein: 8 g/dL (ref 6.1–8.1)
eGFR: 38 mL/min/{1.73_m2} — ABNORMAL LOW (ref >=60)

## 2021-12-10 LAB — CBC
HCT: 35.3 % (ref 35.0–45.0)
Hemoglobin: 11.5 g/dL — ABNORMAL LOW (ref 11.7–15.5)
MCH: 27.5 pg (ref 27.0–33.0)
MCHC: 32.6 g/dL (ref 32.0–36.0)
MCV: 84.4 fL (ref 80.0–100.0)
MPV: 12 fL (ref 7.5–12.5)
Platelets: 235 10*3/uL (ref 140–400)
RBC: 4.18 10*6/uL (ref 3.80–5.10)
RDW: 14.5 % (ref 11.0–15.0)
WBC: 6.9 10*3/uL (ref 3.8–10.8)

## 2021-12-10 LAB — LIPID PANEL
Cholesterol: 178 mg/dL (ref <200)
HDL: 43 mg/dL — ABNORMAL LOW (ref >=50)
LDL Cholesterol (Calc): 114 mg/dL (calc) — ABNORMAL HIGH
Non-HDL Cholesterol (Calc): 135 mg/dL (calc) — ABNORMAL HIGH (ref <130)
Total CHOL/HDL Ratio: 4.1 (calc) (ref <5.0)
Triglycerides: 104 mg/dL (ref <150)

## 2021-12-10 LAB — TSH: TSH: 2.76 mIU/L (ref 0.40–4.50)

## 2021-12-10 LAB — VITAMIN D 25 HYDROXY (VIT D DEFICIENCY, FRACTURES): Vit D, 25-Hydroxy: 27 ng/mL — ABNORMAL LOW (ref 30–100)

## 2021-12-21 ENCOUNTER — Other Ambulatory Visit (HOSPITAL_COMMUNITY): Payer: Self-pay

## 2021-12-21 MED ORDER — OLMESARTAN MEDOXOMIL-HCTZ 40-25 MG PO TABS
ORAL_TABLET | ORAL | 1 refills | Status: DC
  Filled 2021-12-21: qty 90, 90d supply, fill #0
  Filled 2022-03-23: qty 90, 90d supply, fill #1

## 2021-12-22 ENCOUNTER — Other Ambulatory Visit (HOSPITAL_COMMUNITY): Payer: Self-pay

## 2022-01-11 ENCOUNTER — Other Ambulatory Visit (HOSPITAL_COMMUNITY): Payer: Self-pay

## 2022-01-11 DIAGNOSIS — N182 Chronic kidney disease, stage 2 (mild): Secondary | ICD-10-CM | POA: Diagnosis not present

## 2022-01-11 DIAGNOSIS — I10 Essential (primary) hypertension: Secondary | ICD-10-CM | POA: Diagnosis not present

## 2022-01-11 DIAGNOSIS — K219 Gastro-esophageal reflux disease without esophagitis: Secondary | ICD-10-CM | POA: Diagnosis not present

## 2022-01-11 DIAGNOSIS — G479 Sleep disorder, unspecified: Secondary | ICD-10-CM | POA: Diagnosis not present

## 2022-01-11 MED ORDER — HYDROXYZINE HCL 25 MG PO TABS
25.0000 mg | ORAL_TABLET | Freq: Every evening | ORAL | 2 refills | Status: AC
  Filled 2022-01-11: qty 60, 30d supply, fill #0
  Filled 2022-08-26: qty 60, 30d supply, fill #1

## 2022-01-12 ENCOUNTER — Other Ambulatory Visit (HOSPITAL_COMMUNITY): Payer: Self-pay

## 2022-02-15 ENCOUNTER — Other Ambulatory Visit (HOSPITAL_COMMUNITY): Payer: Self-pay

## 2022-02-15 MED ORDER — METOPROLOL TARTRATE 100 MG PO TABS
100.0000 mg | ORAL_TABLET | Freq: Two times a day (BID) | ORAL | 0 refills | Status: AC
  Filled 2022-02-15: qty 180, 90d supply, fill #0

## 2022-03-23 ENCOUNTER — Other Ambulatory Visit (HOSPITAL_COMMUNITY): Payer: Self-pay

## 2022-03-24 ENCOUNTER — Other Ambulatory Visit (HOSPITAL_COMMUNITY): Payer: Self-pay

## 2022-03-25 ENCOUNTER — Other Ambulatory Visit (HOSPITAL_COMMUNITY): Payer: Self-pay

## 2022-03-25 MED ORDER — PANTOPRAZOLE SODIUM 40 MG PO TBEC
40.0000 mg | DELAYED_RELEASE_TABLET | Freq: Every day | ORAL | 1 refills | Status: DC
  Filled 2022-03-25: qty 90, 90d supply, fill #0

## 2022-05-25 ENCOUNTER — Other Ambulatory Visit (HOSPITAL_COMMUNITY): Payer: Self-pay

## 2022-05-25 ENCOUNTER — Other Ambulatory Visit (HOSPITAL_COMMUNITY): Payer: Self-pay | Admitting: Internal Medicine

## 2022-05-25 DIAGNOSIS — I10 Essential (primary) hypertension: Secondary | ICD-10-CM | POA: Diagnosis not present

## 2022-05-25 DIAGNOSIS — M79605 Pain in left leg: Secondary | ICD-10-CM | POA: Diagnosis not present

## 2022-05-25 MED ORDER — BACLOFEN 10 MG PO TABS
ORAL_TABLET | ORAL | 2 refills | Status: DC
  Filled 2022-05-25: qty 30, 10d supply, fill #0

## 2022-05-25 MED ORDER — METOPROLOL TARTRATE 100 MG PO TABS
100.0000 mg | ORAL_TABLET | Freq: Two times a day (BID) | ORAL | 2 refills | Status: AC
  Filled 2022-05-25: qty 180, 90d supply, fill #0
  Filled 2022-10-25: qty 180, 90d supply, fill #1

## 2022-05-26 ENCOUNTER — Ambulatory Visit (HOSPITAL_COMMUNITY)
Admission: RE | Admit: 2022-05-26 | Discharge: 2022-05-26 | Disposition: A | Discharge status: Home / Self Care | Payer: Commercial Managed Care - PPO | Source: Ambulatory Visit | Attending: Internal Medicine | Admitting: Internal Medicine

## 2022-05-26 DIAGNOSIS — M79605 Pain in left leg: Secondary | ICD-10-CM | POA: Insufficient documentation

## 2022-06-08 ENCOUNTER — Other Ambulatory Visit (HOSPITAL_COMMUNITY): Payer: Self-pay

## 2022-06-08 DIAGNOSIS — M5432 Sciatica, left side: Secondary | ICD-10-CM | POA: Diagnosis not present

## 2022-06-08 MED ORDER — METHYLPREDNISOLONE 4 MG PO TBPK
ORAL_TABLET | ORAL | 0 refills | Status: DC
  Filled 2022-06-08: qty 21, 6d supply, fill #0

## 2022-06-09 ENCOUNTER — Other Ambulatory Visit (HOSPITAL_COMMUNITY): Payer: Self-pay

## 2022-06-20 ENCOUNTER — Emergency Department (HOSPITAL_COMMUNITY)
Admission: EM | Admit: 2022-06-20 | Discharge: 2022-06-21 | Disposition: A | Discharge status: Home / Self Care | Payer: Commercial Managed Care - PPO | Attending: Emergency Medicine | Admitting: Emergency Medicine

## 2022-06-20 DIAGNOSIS — M5432 Sciatica, left side: Secondary | ICD-10-CM

## 2022-06-20 DIAGNOSIS — M5441 Lumbago with sciatica, right side: Secondary | ICD-10-CM | POA: Diagnosis not present

## 2022-06-20 DIAGNOSIS — N183 Chronic kidney disease, stage 3 unspecified: Secondary | ICD-10-CM | POA: Diagnosis not present

## 2022-06-20 DIAGNOSIS — M5442 Lumbago with sciatica, left side: Secondary | ICD-10-CM | POA: Diagnosis not present

## 2022-06-20 DIAGNOSIS — Z7982 Long term (current) use of aspirin: Secondary | ICD-10-CM | POA: Diagnosis not present

## 2022-06-20 DIAGNOSIS — Z79899 Other long term (current) drug therapy: Secondary | ICD-10-CM | POA: Diagnosis not present

## 2022-06-20 DIAGNOSIS — I129 Hypertensive chronic kidney disease with stage 1 through stage 4 chronic kidney disease, or unspecified chronic kidney disease: Secondary | ICD-10-CM | POA: Diagnosis not present

## 2022-06-20 DIAGNOSIS — M545 Low back pain, unspecified: Secondary | ICD-10-CM | POA: Diagnosis present

## 2022-06-20 MED ORDER — LIDOCAINE 5 % EX PTCH
1.0000 | MEDICATED_PATCH | CUTANEOUS | Status: DC
  Administered 2022-06-20: 1 via TRANSDERMAL
  Filled 2022-06-20: qty 1

## 2022-06-20 MED ORDER — KETOROLAC TROMETHAMINE 60 MG/2ML IM SOLN: 15.0000 mg | Freq: Once | INTRAMUSCULAR | Status: DC

## 2022-06-20 MED ORDER — METHOCARBAMOL 500 MG PO TABS
500.0000 mg | ORAL_TABLET | Freq: Two times a day (BID) | ORAL | 0 refills | Status: DC
  Filled 2022-06-20: qty 20, 10d supply, fill #0

## 2022-06-20 MED ORDER — METHOCARBAMOL 500 MG PO TABS
500.0000 mg | ORAL_TABLET | Freq: Once | ORAL | Status: AC
  Administered 2022-06-20: 500 mg via ORAL
  Filled 2022-06-20: qty 1

## 2022-06-20 MED ORDER — ACETAMINOPHEN 325 MG PO TABS
325.0000 mg | ORAL_TABLET | Freq: Once | ORAL | Status: AC
  Administered 2022-06-20: 325 mg via ORAL
  Filled 2022-06-20: qty 1

## 2022-06-20 MED ORDER — ACETAMINOPHEN 325 MG PO TABS
650.0000 mg | ORAL_TABLET | Freq: Once | ORAL | Status: AC
  Administered 2022-06-20: 650 mg via ORAL
  Filled 2022-06-20: qty 2

## 2022-06-20 MED ORDER — KETOROLAC TROMETHAMINE 60 MG/2ML IM SOLN
7.5000 mg | Freq: Once | INTRAMUSCULAR | Status: AC
  Administered 2022-06-20: 7.5 mg via INTRAMUSCULAR
  Filled 2022-06-20: qty 2

## 2022-06-20 NOTE — ED Provider Notes (Signed)
[place] [facility] Provider Note   CSN: [id number] Arrival date & time: 06/20/22  1707     History  Chief Complaint  Patient presents with   Back Pain    Annette Walker is a 72 y.o. female with ckd stage 3, HTN, palpitations, who presents with buttock/leg pain.   Pt c/o left buttock pain radiating down the lateral side of her L leg for one month. Pt states she has had this pain before, and actually has an appointment with her PCP this Thursday, however she couldn't handle the pain today so came to the ED. She denies any back pain, trauma, denies saddle anesthesia, no bowel/bladder incontinence.   Patient takes a muscle relaxer for her pain however does not know which one (chart review says baclofen), but it doesn't really help the pain just makes her sleepy.  Denies fevers, IVDU, h/o malignancy, urinary sxs, numbness/tingling, leg weakness. She states she is able to walk it is just painful.   Back Pain      Home Medications Prior to Admission medications   Medication Sig Start Date End Date Taking? Authorizing Provider  methocarbamol (ROBAXIN) 500 MG tablet Take 1 tablet (500 mg total) by mouth 2 (two) times daily. 06/20/22  Yes Audley Hose, MD  aspirin 81 MG tablet Take 81 mg by mouth daily.    [provider]  diclofenac Sodium (VOLTAREN) 1 % GEL Apply 4 g topically 4 (four) times daily as needed for pain. 12/19/19   [provider]  hydrOXYzine (ATARAX) 25 MG tablet Take 1 tablet by mouth every evening as needed for sleep. 12/09/21     hydrOXYzine (ATARAX) 25 MG tablet Take 1-2 tablets (25-50 mg total) by mouth every evening as needed for sleep 01/11/22     methylPREDNISolone (MEDROL) 4 MG TBPK tablet Take as directed for 6 days. 06/08/22     metoprolol tartrate (LOPRESSOR) 100 MG tablet Take 100 mg by mouth 2 (two) times daily. 08/12/19   [provider]  metoprolol tartrate (LOPRESSOR) 100 MG tablet TAKE 1 TABLET BY  MOUTH TWO TIMES DAILY 12/19/19 12/18/20  Nolene Ebbs, MD  metoprolol tartrate (LOPRESSOR) 100 MG tablet Take 1 tablet by mouth 2 times a day 12/28/20     metoprolol tartrate (LOPRESSOR) 100 MG tablet Take 1 tablet (100 mg total) by mouth 2 (two) times daily. 10/29/21   Nolene Ebbs, MD  metoprolol tartrate (LOPRESSOR) 100 MG tablet Take 1 tablet (100 mg total) by mouth 2 (two) times daily. 05/25/22   Nolene Ebbs, MD  naproxen (NAPROSYN) 500 MG tablet Take 1 tablet (500 mg total) by mouth 2 (two) times daily as needed. 07/05/22     Naproxen DR 500 MG TBEC Take 1 tablet (500 mg total) by mouth 2 (two) times daily as needed. 06/30/22   Nolene Ebbs, MD  olmesartan-hydrochlorothiazide (BENICAR HCT) 40-25 MG tablet Take 1 tablet by mouth daily. 08/28/19   [provider]  olmesartan-hydrochlorothiazide (BENICAR HCT) 40-25 MG tablet TAKE 1 TABLET BY MOUTH ONCE A DAY 12/19/19 12/18/20  Nolene Ebbs, MD  olmesartan-hydrochlorothiazide (BENICAR HCT) 40-25 MG tablet TAKE 1 TABLET BY MOUTH ONCE DAILY 08/30/19 08/29/20  Nolene Ebbs, MD  olmesartan-hydrochlorothiazide (BENICAR HCT) 40-25 MG tablet Take 1 tablet by mouth daily 12/23/20     olmesartan-hydrochlorothiazide (BENICAR HCT) 40-25 MG tablet Take 1 tablet by mouth daily. 06/21/22     oxyCODONE (OXY IR/ROXICODONE) 5 MG immediate release tablet Take 1 tablet (5 mg  total) by mouth 2 (two) times daily as needed for severe pain. 07/05/22     oxyCODONE (ROXICODONE) 5 MG immediate release tablet Take 1 tablet (5 mg total) by mouth every 6 (six) hours as needed for severe pain. 06/23/22   Tretha Sciara, MD  pantoprazole (PROTONIX) 40 MG tablet Take 40 mg by mouth daily. 08/12/19   [provider]  pantoprazole (PROTONIX) 40 MG tablet TAKE 1 TABLET BY MOUTH ONCE DAILY 02/07/20 02/06/21  Nolene Ebbs, MD  pantoprazole (PROTONIX) 40 MG tablet Take 1 tablet (40 mg total) by mouth daily. 03/25/22     sucralfate (CARAFATE) 1 g tablet TAKE 1 TABLET BY MOUTH  TWICE DAILY 02/14/20 02/13/21  Nolene Ebbs, MD  tiZANidine (ZANAFLEX) 4 MG tablet Take 1 tablet (4 mg total) by mouth at bedtime as needed. 06/30/22         Allergies    Patient has no known allergies.    Review of Systems   Review of Systems  Musculoskeletal:  Positive for back pain.   Review of systems Negative for f/c.  A 10 point review of systems was performed and is negative unless otherwise reported in HPI.  Physical Exam Updated Vital Signs BP 122/84   Pulse 73   Temp 98.5 F (36.9 C) (Oral)   Resp 19   SpO2 100%  Physical Exam General: Normal appearing female, lying in bed.  HEENT: Sclera anicteric, MMM, trachea midline.  Cardiology: RRR, no murmurs/rubs/gallops. BL radial and DP pulses equal bilaterally.  Resp: Normal respiratory rate and effort. CTAB, no wheezes, rhonchi, crackles.  Abd: Soft, non-tender, non-distended. No rebound tenderness or guarding.  GU: Deferred. MSK: L buttock TTP. + L straight leg raise.  No peripheral edema or signs of trauma. Extremities without deformity or TTP. No cyanosis or clubbing. Skin: warm, dry. No rashes or lesions. Back: No midline T or L spine tenderness. No paraspinal muscle tenderness to palpation. No CVA tenderness.  Neuro: A&Ox4, CNs II-XII grossly intact. MAEs. Sensation grossly intact.  Psych: Normal mood and affect.   ED Results / Procedures / Treatments   Labs (all labs ordered are listed, but only abnormal results are displayed) Labs Reviewed - No data to display  EKG None  Radiology No results found.  Procedures Procedures    Medications Ordered in ED Medications  acetaminophen (TYLENOL) tablet 650 mg (650 mg Oral Given 06/20/22 1922)  acetaminophen (TYLENOL) tablet 325 mg (325 mg Oral Given 06/20/22 2246)  ketorolac (TORADOL) injection 7.5 mg (7.5 mg Intramuscular Given 06/20/22 2248)  methocarbamol (ROBAXIN) tablet 500 mg (500 mg Oral Given 06/20/22 2246)    ED Course/ Medical Decision Making/ A&P                           Medical Decision Making Risk OTC drugs. Prescription drug management.    This patient presents to the ED for concern of left buttock pain radiating to left leg; this involves an extensive number of treatment options, and is a complaint that carries with it a high risk of complications and morbidity.   MDM:    This patient presents with left buttock pain, no back pain. Her pain most consistent with sciatica given location and radiation down left leg. Differential diagnoses includes lumbago versus musculoskeletal spasm / strain versus sciatica. No back pain red flags on history or physical. Presentation not consistent with malignancy (lack of history of malignancy, lack of B symptoms), fracture (no trauma, no  bony tenderness to palpation), cauda equina (no bowel or urinary incontinence/retention, no saddle anesthesia, no distal weakness), pyelonephritis (afebrile, no CVAT, no urinary symptoms). Given the clinical picture with lack of trauma or any signs of deformity, no indication for imaging at this time.  Plan: pain control, reassess       Additional history obtained from chart review.   Reevaluation: After the interventions noted above, I reevaluated the patient and found that they have :improved  Social Determinants of Health: Patient lives independently   Disposition:  Patient's pain improved some after tylenol, toradol, and robaxin. She is stable to f/u with PCP on Thursday. DC w/ PCP f/u scheduled for 3 days and new rx for robaxin. Instructed to not take baclofen while taking robaxin. Instructed to be aware of possible sedation with robaxin as well. Patient reports understanding.   DC w/ discharge instructions/return precautions. All questions answered to patient's satisfaction.      Co morbidities that complicate the patient evaluation  Past Medical History:  Diagnosis Date   Hypertension      Medicines Meds ordered this encounter  Medications    acetaminophen (TYLENOL) tablet 650 mg   acetaminophen (TYLENOL) tablet 325 mg   DISCONTD: ketorolac (TORADOL) injection 15 mg   DISCONTD: lidocaine (LIDODERM) 5 % 1 patch   ketorolac (TORADOL) injection 7.5 mg   methocarbamol (ROBAXIN) tablet 500 mg   methocarbamol (ROBAXIN) 500 MG tablet    Sig: Take 1 tablet (500 mg total) by mouth 2 (two) times daily.    Dispense:  20 tablet    Refill:  0    I have reviewed the patients home medicines and have made adjustments as needed  Problem List / ED Course: Problem List Items Addressed This Visit   None Visit Diagnoses     Sciatica of left side    -  Primary   Relevant Medications   methocarbamol (ROBAXIN) tablet 500 mg (Completed)   methocarbamol (ROBAXIN) 500 MG tablet                   This note was created using dictation software, which may contain spelling or grammatical errors.    Audley Hose, MD 07/06/22 [phone]

## 2022-06-20 NOTE — Discharge Instructions (Addendum)
Thank you for coming to [facility] Emergency Department. You were seen for pain in your left buttock and leg. We did an exam, and these showed likely sciatica.  Please follow up with your primary care provider on Thursday as scheduled. You can take tylenol 1,000 mg every 8 hours, ibuprofen 600 mg every 8 hours for a few days. You can stop taking baclofen and instead try robaxin 500 mg twice per day for one week. You can also utilize over the counter lidocaine patches, and ice or heat for pain. Your primary doctor may recommend physical therapy or stretching. You can try some of the exercises and stretching according to instructions attached.   Do not hesitate to return to the ED or call 911 if you experience: -Worsening symptoms -Numbness/tingling of your leg or groin -Difficulty urinating, urinating when you don't mean to -Falls or trauma -Lightheadedness, passing out -Fevers/chills -Anything else that concerns you

## 2022-06-20 NOTE — ED Provider Triage Note (Cosign Needed Addendum)
Emergency Medicine Provider Triage Evaluation Note  Annette Walker , a 72 y.o. female  was evaluated in triage.  Pt complains of left-sided back pain for 1 month.  Patient states pain radiates down her left leg.  Patient takes a muscle relaxer for her sciatica however does not know which one.  Patient states muscle relaxer did not help this morning.  Patient currently sees her primary care for sciatica management.  Patient denies saddle anesthesia, urinary/fecal incontinence, fevers, recent IV use, to sensation/motor skills, skin color changes, falls  Review of Systems  Positive: See HPI Negative: See HPI  Physical Exam  BP 127/75 (BP Location: Left Arm)   Pulse 87   Temp 98.2 F (36.8 C) (Oral)   Resp 16   SpO2 95%  Gen:   Awake, no distress   Resp:  Normal effort  MSK:   Moves extremities without difficulty  Other:  Bilateral 2+ dorsalis pedis pulses, distal sensation intact, able to wiggle toes and range ankle, no CVA tenderness, no midline tenderness, no paraspinal tenderness, no deformities noted on exam, no crepitus or step-offs palpated, patient is able to ambulate however with a limp  Medical Decision Making  Medically screening exam initiated at 5:27 PM.  Appropriate orders placed.  Link Snuffer was informed that the remainder of the evaluation will be completed by another provider, this initial triage assessment does not replace that evaluation, and the importance of remaining in the ED until their evaluation is complete.  Patient is stable at this time.  Tylenol was given as patient cannot have NSAIDs.   Chuck Hint, PA-C 06/20/22 1731    Chuck Hint, PA-C 06/20/22 1737

## 2022-06-20 NOTE — ED Triage Notes (Signed)
Pt c/o lower back pain radiating to her L leg for one month. Pt states this pain is typical of her sciatica pain. Pt denies saddle anesthesia, no bowel/bladder incontinence.

## 2022-06-21 ENCOUNTER — Other Ambulatory Visit: Payer: Self-pay

## 2022-06-21 ENCOUNTER — Other Ambulatory Visit (HOSPITAL_COMMUNITY): Payer: Self-pay

## 2022-06-21 MED ORDER — OLMESARTAN MEDOXOMIL-HCTZ 40-25 MG PO TABS
1.0000 | ORAL_TABLET | Freq: Every day | ORAL | 1 refills | Status: DC
  Filled 2022-06-21: qty 90, 90d supply, fill #0
  Filled 2022-09-22: qty 90, 90d supply, fill #1

## 2022-06-22 ENCOUNTER — Other Ambulatory Visit (HOSPITAL_COMMUNITY): Payer: Self-pay

## 2022-06-23 ENCOUNTER — Emergency Department (HOSPITAL_COMMUNITY)
Admission: EM | Admit: 2022-06-23 | Discharge: 2022-06-23 | Disposition: A | Discharge status: Home / Self Care | Payer: Commercial Managed Care - PPO | Attending: Emergency Medicine | Admitting: Emergency Medicine

## 2022-06-23 ENCOUNTER — Emergency Department (HOSPITAL_COMMUNITY): Payer: Commercial Managed Care - PPO

## 2022-06-23 ENCOUNTER — Other Ambulatory Visit: Payer: Self-pay

## 2022-06-23 ENCOUNTER — Encounter (HOSPITAL_COMMUNITY): Payer: Self-pay

## 2022-06-23 DIAGNOSIS — M5442 Lumbago with sciatica, left side: Secondary | ICD-10-CM | POA: Insufficient documentation

## 2022-06-23 DIAGNOSIS — M545 Low back pain, unspecified: Secondary | ICD-10-CM | POA: Diagnosis present

## 2022-06-23 DIAGNOSIS — R109 Unspecified abdominal pain: Secondary | ICD-10-CM | POA: Diagnosis not present

## 2022-06-23 DIAGNOSIS — M5432 Sciatica, left side: Secondary | ICD-10-CM

## 2022-06-23 DIAGNOSIS — R6889 Other general symptoms and signs: Secondary | ICD-10-CM | POA: Diagnosis not present

## 2022-06-23 DIAGNOSIS — I700 Atherosclerosis of aorta: Secondary | ICD-10-CM | POA: Diagnosis not present

## 2022-06-23 LAB — CBC WITH DIFFERENTIAL/PLATELET
Abs Immature Granulocytes: 0.02 K/uL (ref 0.00–0.07)
Basophils Absolute: 0.1 K/uL (ref 0.0–0.1)
Basophils Relative: 2 %
Eosinophils Absolute: 0 K/uL (ref 0.0–0.5)
Eosinophils Relative: 1 %
HCT: 40.3 % (ref 36.0–46.0)
Hemoglobin: 12.6 g/dL (ref 12.0–15.0)
Immature Granulocytes: 0 %
Lymphocytes Relative: 24 %
Lymphs Abs: 1.2 K/uL (ref 0.7–4.0)
MCH: 26.4 pg (ref 26.0–34.0)
MCHC: 31.3 g/dL (ref 30.0–36.0)
MCV: 84.5 fL (ref 80.0–100.0)
Monocytes Absolute: 0.2 K/uL (ref 0.1–1.0)
Monocytes Relative: 4 %
Neutro Abs: 3.5 K/uL (ref 1.7–7.7)
Neutrophils Relative %: 69 %
Platelets: 252 K/uL (ref 150–400)
RBC: 4.77 MIL/uL (ref 3.87–5.11)
RDW: 15.6 % — ABNORMAL HIGH (ref 11.5–15.5)
WBC: 5 K/uL (ref 4.0–10.5)
nRBC: 0 % (ref 0.0–0.2)

## 2022-06-23 LAB — COMPREHENSIVE METABOLIC PANEL WITH GFR
ALT: 14 U/L (ref 0–44)
AST: 31 U/L (ref 15–41)
Albumin: 3.6 g/dL (ref 3.5–5.0)
Alkaline Phosphatase: 73 U/L (ref 38–126)
Anion gap: 12 (ref 5–15)
BUN: 49 mg/dL — ABNORMAL HIGH (ref 8–23)
CO2: 22 mmol/L (ref 22–32)
Calcium: 9.3 mg/dL (ref 8.9–10.3)
Chloride: 111 mmol/L (ref 98–111)
Creatinine, Ser: 2.18 mg/dL — ABNORMAL HIGH (ref 0.44–1.00)
GFR, Estimated: 24 mL/min — ABNORMAL LOW
Glucose, Bld: 151 mg/dL — ABNORMAL HIGH (ref 70–99)
Potassium: 3.7 mmol/L (ref 3.5–5.1)
Sodium: 145 mmol/L (ref 135–145)
Total Bilirubin: 0.8 mg/dL (ref 0.3–1.2)
Total Protein: 8.4 g/dL — ABNORMAL HIGH (ref 6.5–8.1)

## 2022-06-23 LAB — LIPASE, BLOOD: Lipase: 37 U/L (ref 11–51)

## 2022-06-23 MED ORDER — OXYCODONE HCL 5 MG PO TABS
5.0000 mg | ORAL_TABLET | Freq: Once | ORAL | Status: AC
  Administered 2022-06-23: 5 mg via ORAL
  Filled 2022-06-23: qty 1

## 2022-06-23 MED ORDER — LACTATED RINGERS IV BOLUS
1000.0000 mL | Freq: Once | INTRAVENOUS | Status: AC
  Administered 2022-06-23: 1000 mL via INTRAVENOUS

## 2022-06-23 MED ORDER — OXYCODONE HCL 5 MG PO TABS
5.0000 mg | ORAL_TABLET | Freq: Four times a day (QID) | ORAL | 0 refills | Status: DC | PRN
  Filled 2022-06-23: qty 17, 5d supply, fill #0

## 2022-06-23 MED ORDER — ACETAMINOPHEN 500 MG PO TABS
1000.0000 mg | ORAL_TABLET | Freq: Once | ORAL | Status: AC
  Administered 2022-06-23: 1000 mg via ORAL
  Filled 2022-06-23: qty 2

## 2022-06-23 MED ORDER — OXYCODONE HCL 5 MG PO TABS: 5.0000 mg | ORAL_TABLET | Freq: Four times a day (QID) | ORAL | 0 refills | Status: DC | PRN

## 2022-06-23 NOTE — ED Provider Notes (Signed)
[facility] Provider Note   CSN: [id number] Arrival date & time: 06/23/22  1537     History Chief Complaint  Patient presents with   Sciatica    HPI Annette Walker is a 72 y.o. female presenting for chief complaint of left-sided flank pain.  She states that she has had back pain that started about a month ago but has been progressive in nature.  Today she had difficulty walking.  States she has been taking Tylenol and Motrin without any improvement of her symptoms.  Was prescribed Robaxin but did not feel any symptomatic improvement with that. Struggling with ambulation but denies dysuria, urinary frequency, urinary incontinence, fecal incontinence, numbness or tingling.  Symptoms localized to left flank to left lower back.  Denies any traumatic injuries or falls.   Patient's recorded medical, surgical, social, medication list and allergies were reviewed in the Snapshot window as part of the initial history.   Review of Systems   Review of Systems  Constitutional:  Negative for chills and fever.  HENT:  Negative for ear pain and sore throat.   Eyes:  Negative for pain and visual disturbance.  Respiratory:  Negative for cough and shortness of breath.   Cardiovascular:  Negative for chest pain and palpitations.  Gastrointestinal:  Negative for abdominal pain and vomiting.  Genitourinary:  Negative for dysuria and hematuria.  Musculoskeletal:  Positive for back pain. Negative for arthralgias.  Skin:  Negative for color change and rash.  Neurological:  Negative for seizures and syncope.  All other systems reviewed and are negative.   Physical Exam Updated Vital Signs BP 123/84 (BP Location: Left Arm)   Pulse (!) 119   Temp 97.9 F (36.6 C) (Oral)   Resp 18   Ht 5\' 6"  (1.676 m)   Wt 122.5 kg   SpO2 100%   BMI 43.58 kg/m  Physical Exam Vitals and nursing note reviewed.  Constitutional:      General: She is not in acute  distress.    Appearance: She is well-developed.  HENT:     Head: Normocephalic and atraumatic.  Eyes:     Conjunctiva/sclera: Conjunctivae normal.  Cardiovascular:     Rate and Rhythm: Normal rate and regular rhythm.     Heart sounds: No murmur heard. Pulmonary:     Effort: Pulmonary effort is normal. No respiratory distress.     Breath sounds: Normal breath sounds.  Abdominal:     General: There is no distension.     Palpations: Abdomen is soft.     Tenderness: There is no abdominal tenderness. There is no right CVA tenderness or left CVA tenderness.  Musculoskeletal:        General: No swelling or tenderness. Normal range of motion.     Cervical back: Neck supple.  Skin:    General: Skin is warm and dry.  Neurological:     General: No focal deficit present.     Mental Status: She is alert and oriented to person, place, and time. Mental status is at baseline.     Cranial Nerves: No cranial nerve deficit.      ED Course/ Medical Decision Making/ A&P    Procedures Procedures   Medications Ordered in ED Medications  acetaminophen (TYLENOL) tablet 1,000 mg (has no administration in time range)  oxyCODONE (Oxy IR/ROXICODONE) immediate release tablet 5 mg (has no administration in time range)  oxyCODONE (Oxy IR/ROXICODONE) immediate release tablet 5 mg (5 mg Oral  Given 06/23/22 1646)  lactated ringers bolus 1,000 mL (1,000 mLs Intravenous New Bag/Given 06/23/22 1856)   Medical Decision Making:   Annette Walker is a 72 y.o. female who presented to the ED today with acute lower back pain over the past 72 hours, detailed above.    Patient's presentation is complicated by their history of advanced age and obesity.  Patient placed on continuous vitals and telemetry monitoring while in ED which was reviewed periodically.   On my initial exam, the pt was with an intact neurologic exam, tolerating ambulation with an antalgic gait and p.o. intake without difficulty.  Patient had no  abnormal DTRs, no midline spinal tenderness.  Patient endorsing complete sensation of the perineum.  Patient without episodes of fecal or urinary incontinence.  Patient has no focal neurologic deficits and reassuring vital signs at this time.  No obvious physical abnormality or injury on exam. Notably, patient denies recent trauma, is afebrile, and denies IVDU.   Reviewed and confirmed nursing documentation for past medical history, family history, social history.    Initial Assessment:   With the patient's presentation of acute back pain in the above setting, most likely diagnosis is musculoskeletal strain. Other diagnoses were considered including (but not limited to) underlying fracture, epidural hematoma, cauda equina syndrome, spinal stenosis, spinal malignancy. These are considered less likely due to history of present illness and physical exam findings.   In particular, lack of fever, substantial history of IV drug use, or substantial neurologic abnormality is less consistent with epidural abscess versus discitis or other spinal infection. In particular,  Initial Plan:  Multimodal pain control described and patient informed on safe usage.  Screening evaluation including below radiographic evaluation reviewed and grossly unremarkable at this time. Patient stable for continued outpatient evaluation and management of their musculoskeletal pains.  Patient referred back to primary care provider for continued evaluation and management.  Given finding of arthropathy, will also refer to spine center for outpatient care and management.  Initial Study Results:   Laboratory studies initiated to evaluate for metabolic discrepancy. Positive for elevated creatinine. This is likely secondary to patient's increased NSAID use.  Will treat with IV fluids in emergency department recommended cessation of NSAID use.  Will have patient follow-up with PCP within 48 hours for repeat lab check. Radiology  CT ABDOMEN  PELVIS WO CONTRAST  Final Result       Disposition:   Based on the above findings, I believe patient is stable for discharge.    Patient and family educated about specific return precautions for given chief complaint and symptoms.  Patient and family educated about follow-up with PCP and spine center.  Patient and family expressed understanding of return precautions and need for follow-up. Patient spoken to regarding all imaging and laboratory results and appropriate follow up for these results. All education provided in verbal and written form and time was allowed for answering of patient questions. Patient discharged.          Emergency Department Medication Summary:   Medications  acetaminophen (TYLENOL) tablet 1,000 mg (has no administration in time range)  oxyCODONE (Oxy IR/ROXICODONE) immediate release tablet 5 mg (has no administration in time range)  oxyCODONE (Oxy IR/ROXICODONE) immediate release tablet 5 mg (5 mg Oral Given 06/23/22 1646)  lactated ringers bolus 1,000 mL (1,000 mLs Intravenous New Bag/Given 06/23/22 1856)     Clinical Impression:  1. Sciatica of left side      Data Unavailable   Final Clinical Impression(s) /  ED Diagnoses Final diagnoses:  Sciatica of left side    Rx / DC Orders ED Discharge Orders          Ordered    oxyCODONE (ROXICODONE) 5 MG immediate release tablet  Every 6 hours PRN        06/23/22 1923              Tretha Sciara, MD 06/23/22 1923

## 2022-06-23 NOTE — ED Triage Notes (Signed)
Pt BIB EMS from home. Pt c/o sciatica nerve pain x1 month getting progressively worse. Pt c/o left side pain that goes up her back.  146/92 98 HR 96% RA

## 2022-06-24 ENCOUNTER — Other Ambulatory Visit (HOSPITAL_COMMUNITY): Payer: Self-pay

## 2022-06-24 DIAGNOSIS — M4716 Other spondylosis with myelopathy, lumbar region: Secondary | ICD-10-CM | POA: Diagnosis not present

## 2022-06-27 ENCOUNTER — Other Ambulatory Visit: Payer: Self-pay | Admitting: Physician Assistant

## 2022-06-27 DIAGNOSIS — M5416 Radiculopathy, lumbar region: Secondary | ICD-10-CM

## 2022-06-27 DIAGNOSIS — M545 Low back pain, unspecified: Secondary | ICD-10-CM | POA: Diagnosis not present

## 2022-06-30 ENCOUNTER — Other Ambulatory Visit (HOSPITAL_COMMUNITY): Payer: Self-pay

## 2022-06-30 MED ORDER — NAPROXEN DR 500 MG PO TBEC
500.0000 mg | DELAYED_RELEASE_TABLET | Freq: Two times a day (BID) | ORAL | 2 refills | Status: DC | PRN
  Filled 2022-06-30 – 2022-07-05 (×3): qty 50, 25d supply, fill #0

## 2022-06-30 MED ORDER — TIZANIDINE HCL 4 MG PO TABS
4.0000 mg | ORAL_TABLET | Freq: Every evening | ORAL | 2 refills | Status: DC | PRN
  Filled 2022-06-30: qty 30, 30d supply, fill #0

## 2022-07-04 ENCOUNTER — Other Ambulatory Visit (HOSPITAL_COMMUNITY): Payer: Self-pay

## 2022-07-05 ENCOUNTER — Other Ambulatory Visit (HOSPITAL_COMMUNITY): Payer: Self-pay

## 2022-07-05 ENCOUNTER — Other Ambulatory Visit: Payer: Self-pay

## 2022-07-05 MED ORDER — OXYCODONE HCL 5 MG PO TABS
5.0000 mg | ORAL_TABLET | Freq: Two times a day (BID) | ORAL | 0 refills | Status: DC | PRN
  Filled 2022-07-05: qty 10, 5d supply, fill #0

## 2022-07-05 MED ORDER — NAPROXEN 500 MG PO TABS
500.0000 mg | ORAL_TABLET | Freq: Two times a day (BID) | ORAL | 2 refills | Status: DC | PRN
  Filled 2022-07-05: qty 60, 30d supply, fill #0

## 2022-07-07 ENCOUNTER — Ambulatory Visit
Admission: RE | Admit: 2022-07-07 | Discharge: 2022-07-07 | Disposition: A | Discharge status: Home / Self Care | Payer: Commercial Managed Care - PPO | Source: Ambulatory Visit | Attending: Physician Assistant | Admitting: Physician Assistant

## 2022-07-07 ENCOUNTER — Ambulatory Visit (HOSPITAL_COMMUNITY)
Admission: EM | Admit: 2022-07-07 | Discharge: 2022-07-07 | Disposition: A | Discharge status: Home / Self Care | Payer: Commercial Managed Care - PPO | Attending: Internal Medicine | Admitting: Internal Medicine

## 2022-07-07 ENCOUNTER — Other Ambulatory Visit (HOSPITAL_COMMUNITY): Payer: Self-pay

## 2022-07-07 ENCOUNTER — Encounter (HOSPITAL_COMMUNITY): Payer: Self-pay

## 2022-07-07 DIAGNOSIS — M5432 Sciatica, left side: Secondary | ICD-10-CM | POA: Diagnosis not present

## 2022-07-07 DIAGNOSIS — M47817 Spondylosis without myelopathy or radiculopathy, lumbosacral region: Secondary | ICD-10-CM | POA: Diagnosis not present

## 2022-07-07 DIAGNOSIS — M48061 Spinal stenosis, lumbar region without neurogenic claudication: Secondary | ICD-10-CM | POA: Diagnosis not present

## 2022-07-07 DIAGNOSIS — M5126 Other intervertebral disc displacement, lumbar region: Secondary | ICD-10-CM | POA: Diagnosis not present

## 2022-07-07 DIAGNOSIS — B49 Unspecified mycosis: Secondary | ICD-10-CM

## 2022-07-07 DIAGNOSIS — M5416 Radiculopathy, lumbar region: Secondary | ICD-10-CM

## 2022-07-07 MED ORDER — DOXYCYCLINE HYCLATE 100 MG PO CAPS
100.0000 mg | ORAL_CAPSULE | Freq: Two times a day (BID) | ORAL | 0 refills | Status: AC
  Filled 2022-07-07: qty 14, 7d supply, fill #0

## 2022-07-07 NOTE — ED Triage Notes (Addendum)
Pt c/o lt lower back pain radiating down leg x3 wks. Denies injury. States d/t pain unable to do much, even cleaning herself. States has been laying on her rt side x2wks now has a rash/sore with odor under rt breast and stomach fold. States took tylenol for pain and put OTC powder to rash/sore. States took oxycodone at 10am.

## 2022-07-07 NOTE — Discharge Instructions (Addendum)
Please continue using the miconazole powder to help the affected areas dry Please continue taking naproxen and muscle relaxant as directed by your primary care physician Maintain adequate hydration Please use heating pad on a 20-minute on-20 minutes off cycle If you have worsening symptoms please return to urgent care to be reevaluated There is no indication for imaging at this time.

## 2022-07-08 DIAGNOSIS — M5416 Radiculopathy, lumbar region: Secondary | ICD-10-CM | POA: Diagnosis not present

## 2022-07-08 NOTE — ED Provider Notes (Signed)
[facility]    CSN: [id number] Arrival date & time: 07/07/22  1129      History   Chief Complaint Chief Complaint  Patient presents with   Back Pain   Rash    HPI Annette Walker is a 72 y.o. female with a history of sciatica comes to urgent care with lower back pain radiating into the left leg.  Patient's symptoms have been recurrent for several years.  She denies any falls or trauma to the.  She was seen and prescribed narcotic medications as well as muscle relaxant.  Patient has been using the narcotic medications with some relief.  Patient also complains of itchy rash under her breasts over the past couple of weeks.  She noticed excoriation on the right inframammary area several days ago.  That area was being discharging foul-smelling purulent material.  She denies any fever or chills.  She is also developed similar skin lesions in the folds in the groin area.  Patient has been applying antifungal powder to the inframammary areas with some improvement in the rash.Marland Kitchen   HPI  Past Medical History:  Diagnosis Date   Hypertension     Patient Active Problem List   Diagnosis Date Noted   Chest discomfort 12/30/2019   CKD (chronic kidney disease), stage III ([place]) 12/30/2019   Essential hypertension 12/30/2019   Palpitations 12/30/2019   Chest pain 12/30/2019   Microcytic anemia 09/21/2015   Acute respiratory failure (HCC)    Open abdominal wall wound 09/16/2015   Necrotizing fasciitis ([place]) 09/16/2015   Abdominal pain, acute    Acute hypoxemic respiratory failure (HCC)    Respiratory failure (HCC)    Lactic acidosis    AKI (acute kidney injury) ([place])     Past Surgical History:  Procedure Laterality Date   IRRIGATION AND DEBRIDEMENT ABDOMEN N/A 09/15/2015   Procedure: IRRIGATION AND DEBRIDEMENT ABDOMEN;  Surgeon: Erroll Luna, MD;  Location: [facility];  Service: General;  Laterality: N/A;    OB History   No obstetric history on file.      Home Medications     Prior to Admission medications   Medication Sig Start Date End Date Taking? Authorizing Provider  doxycycline (VIBRAMYCIN) 100 MG capsule Take 1 capsule (100 mg total) by mouth 2 (two) times daily for 7 days. 07/07/22 07/14/22 Yes Toddy Boyd, Myrene Galas, MD  aspirin 81 MG tablet Take 81 mg by mouth daily.    [provider]  diclofenac Sodium (VOLTAREN) 1 % GEL Apply 4 g topically 4 (four) times daily as needed for pain. 12/19/19   [provider]  hydrOXYzine (ATARAX) 25 MG tablet Take 1 tablet by mouth every evening as needed for sleep. 12/09/21     hydrOXYzine (ATARAX) 25 MG tablet Take 1-2 tablets (25-50 mg total) by mouth every evening as needed for sleep 01/11/22     methocarbamol (ROBAXIN) 500 MG tablet Take 1 tablet (500 mg total) by mouth 2 (two) times daily. 06/20/22   Audley Hose, MD  metoprolol tartrate (LOPRESSOR) 100 MG tablet Take 100 mg by mouth 2 (two) times daily. 08/12/19   [provider]  metoprolol tartrate (LOPRESSOR) 100 MG tablet TAKE 1 TABLET BY MOUTH TWO TIMES DAILY 12/19/19 12/18/20  Nolene Ebbs, MD  metoprolol tartrate (LOPRESSOR) 100 MG tablet Take 1 tablet by mouth 2 times a day 12/28/20     metoprolol tartrate (LOPRESSOR) 100 MG tablet Take 1 tablet (100 mg total) by mouth 2 (two) times daily. 10/29/21  Nolene Ebbs, MD  metoprolol tartrate (LOPRESSOR) 100 MG tablet Take 1 tablet (100 mg total) by mouth 2 (two) times daily. 05/25/22   Nolene Ebbs, MD  naproxen (NAPROSYN) 500 MG tablet Take 1 tablet (500 mg total) by mouth 2 (two) times daily as needed. 07/05/22     Naproxen DR 500 MG TBEC Take 1 tablet (500 mg total) by mouth 2 (two) times daily as needed. 06/30/22   Nolene Ebbs, MD  olmesartan-hydrochlorothiazide (BENICAR HCT) 40-25 MG tablet Take 1 tablet by mouth daily. 08/28/19   [provider]  olmesartan-hydrochlorothiazide (BENICAR HCT) 40-25 MG tablet TAKE 1 TABLET BY MOUTH ONCE A DAY 12/19/19 12/18/20  Nolene Ebbs, MD   olmesartan-hydrochlorothiazide (BENICAR HCT) 40-25 MG tablet TAKE 1 TABLET BY MOUTH ONCE DAILY 08/30/19 08/29/20  Nolene Ebbs, MD  olmesartan-hydrochlorothiazide (BENICAR HCT) 40-25 MG tablet Take 1 tablet by mouth daily 12/23/20     olmesartan-hydrochlorothiazide (BENICAR HCT) 40-25 MG tablet Take 1 tablet by mouth daily. 06/21/22     oxyCODONE (OXY IR/ROXICODONE) 5 MG immediate release tablet Take 1 tablet (5 mg total) by mouth 2 (two) times daily as needed for severe pain. 07/05/22     oxyCODONE (ROXICODONE) 5 MG immediate release tablet Take 1 tablet (5 mg total) by mouth every 6 (six) hours as needed for severe pain. 06/23/22   Tretha Sciara, MD  pantoprazole (PROTONIX) 40 MG tablet Take 40 mg by mouth daily. 08/12/19   [provider]  pantoprazole (PROTONIX) 40 MG tablet TAKE 1 TABLET BY MOUTH ONCE DAILY 02/07/20 02/06/21  Nolene Ebbs, MD  pantoprazole (PROTONIX) 40 MG tablet Take 1 tablet (40 mg total) by mouth daily. 03/25/22     sucralfate (CARAFATE) 1 g tablet TAKE 1 TABLET BY MOUTH TWICE DAILY 02/14/20 02/13/21  Nolene Ebbs, MD  tiZANidine (ZANAFLEX) 4 MG tablet Take 1 tablet (4 mg total) by mouth at bedtime as needed. 06/30/22       Family History Family History  Problem Relation Age of Onset   Pulmonary embolism Mother    Prostate cancer Father     Social History Social History   Tobacco Use   Smoking status: Never   Smokeless tobacco: Never  Substance Use Topics   Alcohol use: Yes    Alcohol/week: 1.0 standard drink of alcohol    Types: 1 Glasses of wine per week   Drug use: No     Allergies   Patient has no known allergies.   Review of Systems Review of Systems As per HPI  Physical Exam Triage Vital Signs ED Triage Vitals [07/07/22 1315]  Enc Vitals Group     BP 122/64     Pulse Rate 71     Resp 18     Temp 97.8 F (36.6 C)     Temp Source Oral     SpO2 98 %     Weight      Height      Head Circumference      Peak Flow      Pain Score  8     Pain Loc      Pain Edu?      Excl. in [place]?    No data found.  Updated Vital Signs BP 122/64 (BP Location: Left Arm)   Pulse 71   Temp 97.8 F (36.6 C) (Oral)   Resp 18   SpO2 98%   Visual Acuity Right Eye Distance:   Left Eye Distance:   Bilateral Distance:  Right Eye Near:   Left Eye Near:    Bilateral Near:     Physical Exam Vitals and nursing note reviewed.  Cardiovascular:     Rate and Rhythm: Normal rate and regular rhythm.     Pulses: Normal pulses.     Heart sounds: Normal heart sounds.  Pulmonary:     Effort: Pulmonary effort is normal.     Breath sounds: Normal breath sounds.  Abdominal:     General: Bowel sounds are normal.     Palpations: Abdomen is soft.  Musculoskeletal:        General: Normal range of motion.  Skin:    Capillary Refill: Capillary refill takes less than 2 seconds.     Comments: Rash under both breasts with excoriations.  The right inframammary area has an infected shallow ulcer measuring about 1 inch in the longest diameter with foul-smelling discharge.  There is some maceration of the skin under both breasts.  No significant erythema.  Neurological:     General: No focal deficit present.     Mental Status: She is alert and oriented to person, place, and time.  Psychiatric:        Mood and Affect: Mood normal.      UC Treatments / Results  Labs (all labs ordered are listed, but only abnormal results are displayed) Labs Reviewed - No data to display  EKG   Radiology   Procedures Procedures (including critical care time)  Medications Ordered in UC Medications - No data to display  Initial Impression / Assessment and Plan / UC Course  I have reviewed the triage vital signs and the nursing notes.  Pertinent labs & imaging results that were available during my care of the patient were reviewed by me and considered in my medical decision making (see chart for details).     1.  Left-sided sciatica: Patient is  advised to continue pain medications and muscle relaxants prescribed by her primary care physician Heating pad use only 20 minutes on-20 minutes off cycle Gentle stretching exercises recommended Medications as recommended  2.  Fungal dermatitis involving the inframammary region with bacterial infection: Doxycycline 100 mg twice daily for 7 days Patient is advised to continue antifungal powder Return precautions if symptoms worsen. Final Clinical Impressions(s) / UC Diagnoses   Final diagnoses:  Left sided sciatica  Fungal infection     Discharge Instructions      Please continue using the miconazole powder to help the affected areas dry Please continue taking naproxen and muscle relaxant as directed by your primary care physician Maintain adequate hydration Please use heating pad on a 20-minute on-20 minutes off cycle If you have worsening symptoms please return to urgent care to be reevaluated There is no indication for imaging at this time.   ED Prescriptions     Medication Sig Dispense Auth. Provider   doxycycline (VIBRAMYCIN) 100 MG capsule Take 1 capsule (100 mg total) by mouth 2 (two) times daily for 7 days. 14 capsule Ramar Nobrega, Myrene Galas, MD      PDMP not reviewed this encounter.   Chase Picket, MD 07/08/22 [phone]

## 2022-07-11 DIAGNOSIS — I10 Essential (primary) hypertension: Secondary | ICD-10-CM | POA: Diagnosis not present

## 2022-07-11 DIAGNOSIS — M4716 Other spondylosis with myelopathy, lumbar region: Secondary | ICD-10-CM | POA: Diagnosis not present

## 2022-07-20 ENCOUNTER — Other Ambulatory Visit (HOSPITAL_COMMUNITY): Payer: Self-pay

## 2022-07-20 MED ORDER — OXYCODONE HCL 5 MG PO TABS
5.0000 mg | ORAL_TABLET | Freq: Two times a day (BID) | ORAL | 0 refills | Status: DC | PRN
  Filled 2022-07-20: qty 6, 3d supply, fill #0

## 2022-07-23 ENCOUNTER — Other Ambulatory Visit (HOSPITAL_COMMUNITY): Payer: Self-pay

## 2022-08-03 ENCOUNTER — Other Ambulatory Visit (HOSPITAL_COMMUNITY): Payer: Self-pay

## 2022-08-03 DIAGNOSIS — M5416 Radiculopathy, lumbar region: Secondary | ICD-10-CM | POA: Diagnosis not present

## 2022-08-03 MED ORDER — GABAPENTIN 300 MG PO CAPS
300.0000 mg | ORAL_CAPSULE | Freq: Every evening | ORAL | 1 refills | Status: AC
  Filled 2022-08-03: qty 30, 30d supply, fill #0

## 2022-08-04 ENCOUNTER — Other Ambulatory Visit (HOSPITAL_COMMUNITY): Payer: Self-pay

## 2022-10-05 DIAGNOSIS — M5416 Radiculopathy, lumbar region: Secondary | ICD-10-CM | POA: Diagnosis not present

## 2022-10-19 DIAGNOSIS — M5416 Radiculopathy, lumbar region: Secondary | ICD-10-CM | POA: Diagnosis not present

## 2022-12-27 ENCOUNTER — Other Ambulatory Visit (HOSPITAL_COMMUNITY): Payer: Self-pay

## 2022-12-27 MED ORDER — OLMESARTAN MEDOXOMIL-HCTZ 40-25 MG PO TABS
1.0000 | ORAL_TABLET | Freq: Every day | ORAL | 1 refills | Status: DC
  Filled 2022-12-27: qty 90, 90d supply, fill #0
  Filled 2023-04-10: qty 90, 90d supply, fill #1

## 2022-12-28 ENCOUNTER — Other Ambulatory Visit (HOSPITAL_COMMUNITY): Payer: Self-pay

## 2023-01-10 ENCOUNTER — Other Ambulatory Visit (HOSPITAL_COMMUNITY): Payer: Self-pay

## 2023-01-10 DIAGNOSIS — Z1239 Encounter for other screening for malignant neoplasm of breast: Secondary | ICD-10-CM | POA: Diagnosis not present

## 2023-01-10 DIAGNOSIS — E559 Vitamin D deficiency, unspecified: Secondary | ICD-10-CM | POA: Diagnosis not present

## 2023-01-10 DIAGNOSIS — Z131 Encounter for screening for diabetes mellitus: Secondary | ICD-10-CM | POA: Diagnosis not present

## 2023-01-10 DIAGNOSIS — E2839 Other primary ovarian failure: Secondary | ICD-10-CM | POA: Diagnosis not present

## 2023-01-10 DIAGNOSIS — Z0000 Encounter for general adult medical examination without abnormal findings: Secondary | ICD-10-CM | POA: Diagnosis not present

## 2023-01-10 DIAGNOSIS — I10 Essential (primary) hypertension: Secondary | ICD-10-CM | POA: Diagnosis not present

## 2023-01-10 DIAGNOSIS — Z13220 Encounter for screening for lipoid disorders: Secondary | ICD-10-CM | POA: Diagnosis not present

## 2023-01-10 DIAGNOSIS — M4716 Other spondylosis with myelopathy, lumbar region: Secondary | ICD-10-CM | POA: Diagnosis not present

## 2023-01-10 MED ORDER — OLMESARTAN MEDOXOMIL-HCTZ 40-25 MG PO TABS
1.0000 | ORAL_TABLET | Freq: Every day | ORAL | 2 refills | Status: DC
  Filled 2023-01-10: qty 90, 90d supply, fill #0

## 2023-01-10 MED ORDER — NAPROXEN 500 MG PO TABS
500.0000 mg | ORAL_TABLET | Freq: Two times a day (BID) | ORAL | 2 refills | Status: DC | PRN
  Filled 2023-01-10: qty 180, 90d supply, fill #0

## 2023-01-10 MED ORDER — BACLOFEN 10 MG PO TABS
10.0000 mg | ORAL_TABLET | Freq: Three times a day (TID) | ORAL | 2 refills | Status: AC | PRN
  Filled 2023-01-10 – 2023-03-07 (×2): qty 90, 30d supply, fill #0

## 2023-01-11 ENCOUNTER — Other Ambulatory Visit (HOSPITAL_COMMUNITY): Payer: Self-pay

## 2023-01-11 ENCOUNTER — Other Ambulatory Visit: Payer: Self-pay | Admitting: Internal Medicine

## 2023-01-11 ENCOUNTER — Other Ambulatory Visit: Payer: Self-pay

## 2023-01-11 DIAGNOSIS — E559 Vitamin D deficiency, unspecified: Secondary | ICD-10-CM

## 2023-01-11 DIAGNOSIS — E2839 Other primary ovarian failure: Secondary | ICD-10-CM

## 2023-01-20 ENCOUNTER — Other Ambulatory Visit (HOSPITAL_COMMUNITY): Payer: Self-pay

## 2023-02-07 ENCOUNTER — Other Ambulatory Visit (HOSPITAL_COMMUNITY): Payer: Self-pay

## 2023-02-07 DIAGNOSIS — I10 Essential (primary) hypertension: Secondary | ICD-10-CM | POA: Diagnosis not present

## 2023-02-07 DIAGNOSIS — N182 Chronic kidney disease, stage 2 (mild): Secondary | ICD-10-CM | POA: Diagnosis not present

## 2023-02-07 MED ORDER — METOPROLOL TARTRATE 100 MG PO TABS
100.0000 mg | ORAL_TABLET | Freq: Two times a day (BID) | ORAL | 2 refills | Status: AC
  Filled 2023-02-07 – 2023-03-07 (×2): qty 180, 90d supply, fill #0
  Filled 2023-06-12: qty 180, 90d supply, fill #1
  Filled 2023-08-01: qty 180, 90d supply, fill #2

## 2023-02-08 ENCOUNTER — Other Ambulatory Visit (HOSPITAL_COMMUNITY): Payer: Self-pay

## 2023-02-20 ENCOUNTER — Other Ambulatory Visit (HOSPITAL_COMMUNITY): Payer: Self-pay

## 2023-03-07 ENCOUNTER — Other Ambulatory Visit (HOSPITAL_COMMUNITY): Payer: Self-pay

## 2023-04-12 ENCOUNTER — Other Ambulatory Visit (HOSPITAL_COMMUNITY): Payer: Self-pay

## 2023-07-16 ENCOUNTER — Encounter (HOSPITAL_COMMUNITY): Payer: Self-pay

## 2023-07-16 ENCOUNTER — Ambulatory Visit (INDEPENDENT_AMBULATORY_CARE_PROVIDER_SITE_OTHER)

## 2023-07-16 ENCOUNTER — Ambulatory Visit (HOSPITAL_COMMUNITY)
Admission: EM | Admit: 2023-07-16 | Discharge: 2023-07-16 | Disposition: A | Discharge status: Home / Self Care | Attending: Emergency Medicine | Admitting: Emergency Medicine

## 2023-07-16 DIAGNOSIS — M25572 Pain in left ankle and joints of left foot: Secondary | ICD-10-CM

## 2023-07-16 DIAGNOSIS — M7989 Other specified soft tissue disorders: Secondary | ICD-10-CM | POA: Diagnosis not present

## 2023-07-16 DIAGNOSIS — M7752 Other enthesopathy of left foot: Secondary | ICD-10-CM | POA: Diagnosis not present

## 2023-07-16 DIAGNOSIS — M19072 Primary osteoarthritis, left ankle and foot: Secondary | ICD-10-CM | POA: Diagnosis not present

## 2023-07-16 MED ORDER — DICLOFENAC SODIUM 1 % EX GEL: 2.0000 g | Freq: Four times a day (QID) | CUTANEOUS | 0 refills | Status: AC

## 2023-07-16 NOTE — ED Triage Notes (Signed)
 Patient presenting with left foot pain and swelling onset this past wednesday. No known falls or injures. Last time this happened the Patient states she was diagnosed with sciatica.  Prescriptions or OTC medications tried: Yes- tylenol and a muscle relaxer     with moderate relief

## 2023-07-16 NOTE — ED Provider Notes (Signed)
 [facility]    CSN: [id number] Arrival date & time: 07/16/23  1419      History   Chief Complaint Chief Complaint  Patient presents with   Foot Pain    HPI Annette Walker is a 73 y.o. female.   Patient presents with left foot pain and swelling  since 2/26. Denies any known injury.   Patient states that she believes this is related to her sciatica. Denies back pain, numbness, tinging, and weakness.   Patient reports she has been taking Tylenol and a muscle relaxer with some relief.    Foot Pain    Past Medical History:  Diagnosis Date   Hypertension     Patient Active Problem List   Diagnosis Date Noted   Chest discomfort 12/30/2019   CKD (chronic kidney disease), stage III (HCC) 12/30/2019   Essential hypertension 12/30/2019   Palpitations 12/30/2019   Chest pain 12/30/2019   Microcytic anemia 09/21/2015   Acute respiratory failure (HCC)    Open abdominal wall wound 09/16/2015   Necrotizing fasciitis (HCC) 09/16/2015   Abdominal pain, acute    Acute hypoxemic respiratory failure (HCC)    Respiratory failure (HCC)    Lactic acidosis    AKI (acute kidney injury) (HCC)     Past Surgical History:  Procedure Laterality Date   IRRIGATION AND DEBRIDEMENT ABDOMEN N/A 09/15/2015   Procedure: IRRIGATION AND DEBRIDEMENT ABDOMEN;  Surgeon: Harriette Bouillon, MD;  Location: [facility] OR;  Service: General;  Laterality: N/A;    OB History   No obstetric history on file.      Home Medications    Prior to Admission medications   Medication Sig Start Date End Date Taking? Authorizing Provider  aspirin 81 MG tablet Take 81 mg by mouth daily.   Yes [provider]  baclofen (LIORESAL) 10 MG tablet Take 1 tablet (10 mg total) by mouth 3 (three) times daily as needed for cramps 01/10/23  Yes   diclofenac Sodium (VOLTAREN ARTHRITIS PAIN) 1 % GEL Apply 2 g topically 4 (four) times daily. 07/16/23  Yes Wynonia Lawman A, NP  metoprolol tartrate (LOPRESSOR) 100 MG  tablet Take 1 tablet by mouth 2 times a day 12/28/20  Yes   olmesartan-hydrochlorothiazide (BENICAR HCT) 40-25 MG tablet Take 1 tablet by mouth daily 12/23/20  Yes   gabapentin (NEURONTIN) 300 MG capsule Take 1 capsule (300 mg total) by mouth at bedtime. 08/03/22     hydrOXYzine (ATARAX) 25 MG tablet Take 1 tablet by mouth every evening as needed for sleep. 12/09/21     hydrOXYzine (ATARAX) 25 MG tablet Take 1-2 tablets (25-50 mg total) by mouth every evening as needed for sleep 01/11/22     metoprolol tartrate (LOPRESSOR) 100 MG tablet Take 1 tablet (100 mg total) by mouth 2 (two) times daily. 10/29/21   [name], MD  metoprolol tartrate (LOPRESSOR) 100 MG tablet Take 1 tablet (100 mg total) by mouth 2 (two) times daily. 05/25/22   [name], MD  metoprolol tartrate (LOPRESSOR) 100 MG tablet Take 1 tablet (100 mg total) by mouth 2 (two) times daily. 02/07/23     olmesartan-hydrochlorothiazide (BENICAR HCT) 40-25 MG tablet TAKE 1 TABLET BY MOUTH ONCE A DAY 12/19/19 12/18/20  [name], MD    Family History Family History  Problem Relation Age of Onset   Pulmonary embolism Mother    Prostate cancer Father     Social History Social History   Tobacco Use   Smoking status: Never  Smokeless tobacco: Never  Substance Use Topics   Alcohol use: Yes    Alcohol/week: 1.0 standard drink of alcohol    Types: 1 Glasses of wine per week   Drug use: No     Allergies   Patient has no known allergies.   Review of Systems Review of Systems  Per HPI  Physical Exam Triage Vital Signs ED Triage Vitals  Encounter Vitals Group     BP 07/16/23 1457 100/73     Systolic BP Percentile --      Diastolic BP Percentile --      Pulse Rate 07/16/23 1457 76     Resp 07/16/23 1457 18     Temp 07/16/23 1457 97.9 F (36.6 C)     Temp Source 07/16/23 1457 Oral     SpO2 07/16/23 1457 98 %     Weight 07/16/23 1457 270 lb 1 oz (122.5 kg)     Height 07/16/23 1457 5\' 6"  (1.676 m)     Head  Circumference --      Peak Flow --      Pain Score 07/16/23 1453 10     Pain Loc --      Pain Education --      Exclude from Growth Chart --    No data found.  Updated Vital Signs BP 100/73 (BP Location: Right Arm)   Pulse 76   Temp 97.9 F (36.6 C) (Oral)   Resp 18   Ht 5\' 6"  (1.676 m)   Wt 270 lb 1 oz (122.5 kg)   SpO2 98%   BMI 43.59 kg/m   Visual Acuity Right Eye Distance:   Left Eye Distance:   Bilateral Distance:    Right Eye Near:   Left Eye Near:    Bilateral Near:     Physical Exam Vitals and nursing note reviewed.  Constitutional:      General: She is awake. She is not in acute distress.    Appearance: Normal appearance. She is well-developed and well-groomed. She is not ill-appearing.  Musculoskeletal:     Right foot: Normal.     Left foot: Normal range of motion. Swelling, tenderness and bony tenderness present. No deformity.  Neurological:     Mental Status: She is alert.  Psychiatric:        Behavior: Behavior is cooperative.      UC Treatments / Results  Labs (all labs ordered are listed, but only abnormal results are displayed) Labs Reviewed - No data to display  EKG   Radiology DG Foot Complete Left Result Date: 07/16/2023 CLINICAL DATA:  Foot pain with swelling EXAM: LEFT FOOT - COMPLETE 3+ VIEW COMPARISON:  None Available. FINDINGS: No malalignment. Moderate plantar calcaneal spur and large posterior enthesophyte. Dorsal degenerative osteophytes. Question cortex fracture/discontinuity at the second middle phalanx on one view. Mild degenerative change at the first MTP joint IMPRESSION: 1. Question cortex fracture/discontinuity at second middle phalanx on one view, correlate for point tenderness. 2. Otherwise no acute osseous abnormality Electronically Signed   By: Jasmine Pang M.D.   On: 07/16/2023 16:00    Procedures Procedures (including critical care time)  Medications Ordered in UC Medications - No data to display  Initial  Impression / Assessment and Plan / UC Course  I have reviewed the triage vital signs and the nursing notes.  Pertinent labs & imaging results that were available during my care of the patient were reviewed by me and considered in my medical decision making (see  chart for details).     Upon assessment moderate swelling and tenderness noted to the dorsal aspect of her left foot. Without decreased ROM or obvious deformity.  Based on my interpretation of X-ray there is no obvious fracture or injury noted. It does appear there may be some arthritic changes throughout the foot.   Radiologist report suggested possible fracture to the second middle phalanx, but did unlikely due to negative for point tenderness.   Prescribed Voltaren gel to apply to the area and recommended Tylenol for pain. Discussed follow-up and return precautions.  Final Clinical Impressions(s) / UC Diagnoses   Final diagnoses:  Pain of joint of left ankle and foot     Discharge Instructions      Your X-ray did not reveal any obvious fracture or injury. It did reveal arthritic changes which could likely be the cause of your pain.  I recommend taking Tylenol as needed for pain, and you can also apply Voltaren gel to the area to help with pain.   Otherwise you can apply heat to the area for pain and swelling.   If pain and swelling persist, I recommend following up with [facility] for further management and evaluation. Return here as needed.    ED Prescriptions     Medication Sig Dispense Auth. Provider   diclofenac Sodium (VOLTAREN ARTHRITIS PAIN) 1 % GEL Apply 2 g topically 4 (four) times daily. 100 g Wynonia Lawman A, NP      PDMP not reviewed this encounter.   Wynonia Lawman A, NP 07/16/23 [id number]

## 2023-07-16 NOTE — Discharge Instructions (Addendum)
 Your X-ray did not reveal any obvious fracture or injury. It did reveal arthritic changes which could likely be the cause of your pain.  I recommend taking Tylenol as needed for pain, and you can also apply Voltaren gel to the area to help with pain.   Otherwise you can apply heat to the area for pain and swelling.   If pain and swelling persist, I recommend following up with [facility] for further management and evaluation. Return here as needed.

## 2023-07-21 ENCOUNTER — Other Ambulatory Visit (HOSPITAL_COMMUNITY): Payer: Self-pay

## 2023-07-21 DIAGNOSIS — M25572 Pain in left ankle and joints of left foot: Secondary | ICD-10-CM | POA: Diagnosis not present

## 2023-07-21 DIAGNOSIS — M79672 Pain in left foot: Secondary | ICD-10-CM | POA: Diagnosis not present

## 2023-07-21 MED ORDER — METHYLPREDNISOLONE 4 MG PO TBPK
ORAL_TABLET | ORAL | 0 refills | Status: AC
  Filled 2023-07-21: qty 21, 6d supply, fill #0

## 2023-07-28 ENCOUNTER — Other Ambulatory Visit: Payer: Self-pay

## 2023-07-28 ENCOUNTER — Other Ambulatory Visit (HOSPITAL_COMMUNITY): Payer: Self-pay

## 2023-07-28 MED ORDER — OLMESARTAN MEDOXOMIL-HCTZ 40-25 MG PO TABS
1.0000 | ORAL_TABLET | Freq: Every day | ORAL | 2 refills | Status: AC
  Filled 2023-07-28: qty 90, 90d supply, fill #0
  Filled 2023-11-14: qty 90, 90d supply, fill #1
  Filled 2024-02-26: qty 90, 90d supply, fill #2

## 2023-07-28 MED ORDER — METOPROLOL TARTRATE 100 MG PO TABS
100.0000 mg | ORAL_TABLET | Freq: Two times a day (BID) | ORAL | 2 refills | Status: AC
  Filled 2023-07-28: qty 180, 90d supply, fill #0

## 2023-08-01 ENCOUNTER — Encounter (HOSPITAL_COMMUNITY): Payer: Self-pay | Admitting: Pharmacist

## 2023-08-01 ENCOUNTER — Other Ambulatory Visit (HOSPITAL_COMMUNITY): Payer: Self-pay

## 2023-08-15 ENCOUNTER — Other Ambulatory Visit (HOSPITAL_COMMUNITY): Payer: Self-pay

## 2023-08-15 DIAGNOSIS — N182 Chronic kidney disease, stage 2 (mild): Secondary | ICD-10-CM | POA: Diagnosis not present

## 2023-08-15 DIAGNOSIS — M109 Gout, unspecified: Secondary | ICD-10-CM | POA: Diagnosis not present

## 2023-08-15 MED ORDER — COLCHICINE 0.6 MG PO TABS
0.6000 mg | ORAL_TABLET | Freq: Two times a day (BID) | ORAL | 2 refills | Status: AC
  Filled 2023-08-15: qty 30, 15d supply, fill #0

## 2023-08-16 ENCOUNTER — Other Ambulatory Visit (HOSPITAL_COMMUNITY): Payer: Self-pay

## 2023-08-16 ENCOUNTER — Other Ambulatory Visit (HOSPITAL_BASED_OUTPATIENT_CLINIC_OR_DEPARTMENT_OTHER): Payer: Self-pay

## 2023-08-16 MED ORDER — ALLOPURINOL 100 MG PO TABS
100.0000 mg | ORAL_TABLET | Freq: Every day | ORAL | 5 refills | Status: AC
  Filled 2023-08-16: qty 30, 30d supply, fill #0

## 2023-10-17 ENCOUNTER — Other Ambulatory Visit (HOSPITAL_COMMUNITY): Payer: Self-pay

## 2023-10-17 ENCOUNTER — Other Ambulatory Visit (HOSPITAL_BASED_OUTPATIENT_CLINIC_OR_DEPARTMENT_OTHER): Payer: Self-pay

## 2023-10-17 MED ORDER — COLCHICINE 0.6 MG PO TABS
0.6000 mg | ORAL_TABLET | Freq: Two times a day (BID) | ORAL | 5 refills | Status: AC | PRN
  Filled 2023-10-17 – 2023-11-14 (×2): qty 30, 15d supply, fill #0
  Filled 2024-04-08: qty 30, 15d supply, fill #1

## 2023-10-26 ENCOUNTER — Other Ambulatory Visit (HOSPITAL_COMMUNITY): Payer: Self-pay

## 2023-11-14 ENCOUNTER — Other Ambulatory Visit (HOSPITAL_COMMUNITY): Payer: Self-pay

## 2024-01-09 ENCOUNTER — Other Ambulatory Visit (HOSPITAL_COMMUNITY): Payer: Self-pay

## 2024-01-09 MED ORDER — METOPROLOL TARTRATE 100 MG PO TABS
100.0000 mg | ORAL_TABLET | Freq: Two times a day (BID) | ORAL | 2 refills | Status: AC
  Filled 2024-01-09: qty 180, 90d supply, fill #0
  Filled 2024-05-16: qty 180, 90d supply, fill #1

## 2024-01-17 ENCOUNTER — Other Ambulatory Visit (HOSPITAL_COMMUNITY): Payer: Self-pay

## 2024-01-17 MED ORDER — VITAMIN D (ERGOCALCIFEROL) 50000 UNITS PO CAPS
ORAL_CAPSULE | ORAL | 5 refills | Status: AC
  Filled 2024-01-17: qty 4, 28d supply, fill #0
  Filled 2024-02-26: qty 4, 28d supply, fill #1

## 2024-02-26 ENCOUNTER — Other Ambulatory Visit (HOSPITAL_COMMUNITY): Payer: Self-pay

## 2024-02-26 ENCOUNTER — Other Ambulatory Visit: Payer: Self-pay

## 2024-03-01 ENCOUNTER — Other Ambulatory Visit (HOSPITAL_COMMUNITY): Payer: Self-pay

## 2024-04-08 ENCOUNTER — Other Ambulatory Visit (HOSPITAL_COMMUNITY): Payer: Self-pay

## 2024-05-16 ENCOUNTER — Other Ambulatory Visit (HOSPITAL_COMMUNITY): Payer: Self-pay

## 2024-06-07 ENCOUNTER — Other Ambulatory Visit: Payer: Self-pay

## 2024-06-07 ENCOUNTER — Other Ambulatory Visit (HOSPITAL_COMMUNITY): Payer: Self-pay

## 2024-06-07 MED ORDER — OLMESARTAN MEDOXOMIL-HCTZ 40-25 MG PO TABS
1.0000 | ORAL_TABLET | Freq: Every day | ORAL | 2 refills | Status: AC
  Filled 2024-06-07: qty 90, 90d supply, fill #0
